# Patient Record
Sex: Female | Born: 1970 | Race: White | Hispanic: No | Smoking: Never smoker
Health system: Southern US, Community
[De-identification: ages and names within clinical notes are randomized; demographics above are authoritative.]

## PROBLEM LIST (undated history)

## (undated) DIAGNOSIS — Z6841 Body Mass Index (BMI) 40.0 and over, adult: Secondary | ICD-10-CM

## (undated) DIAGNOSIS — G4730 Sleep apnea, unspecified: Secondary | ICD-10-CM

## (undated) DIAGNOSIS — L120 Bullous pemphigoid: Secondary | ICD-10-CM

## (undated) DIAGNOSIS — K219 Gastro-esophageal reflux disease without esophagitis: Secondary | ICD-10-CM

## (undated) DIAGNOSIS — J45909 Unspecified asthma, uncomplicated: Secondary | ICD-10-CM

## (undated) DIAGNOSIS — J309 Allergic rhinitis, unspecified: Secondary | ICD-10-CM

## (undated) DIAGNOSIS — I10 Essential (primary) hypertension: Secondary | ICD-10-CM

## (undated) DIAGNOSIS — R6889 Other general symptoms and signs: Secondary | ICD-10-CM

## (undated) DIAGNOSIS — F39 Unspecified mood [affective] disorder: Secondary | ICD-10-CM

## (undated) DIAGNOSIS — Z9989 Dependence on other enabling machines and devices: Secondary | ICD-10-CM

## (undated) DIAGNOSIS — F32A Depression, unspecified: Secondary | ICD-10-CM

## (undated) DIAGNOSIS — F329 Major depressive disorder, single episode, unspecified: Secondary | ICD-10-CM

## (undated) HISTORY — DX: Depression, unspecified: F32.A

## (undated) HISTORY — DX: Major depressive disorder, single episode, unspecified: F32.9

## (undated) HISTORY — DX: Gastro-esophageal reflux disease without esophagitis: K21.9

## (undated) HISTORY — PX: ADENOIDECTOMY: SUR15

## (undated) HISTORY — PX: HX CARPAL TUNNEL RELEASE: SHX101

## (undated) HISTORY — DX: Allergic rhinitis, unspecified: J30.9

## (undated) HISTORY — DX: Essential (primary) hypertension: I10

## (undated) HISTORY — PX: HX BUNIONECTOMY: SHX129

## (undated) HISTORY — DX: Unspecified mood (affective) disorder: F39

## (undated) HISTORY — PX: GASTROSCOPY: WVUENDOPRO49

## (undated) HISTORY — PX: HX GALL BLADDER SURGERY/CHOLE: SHX55

## (undated) HISTORY — DX: Bullous pemphigoid: L12.0

## (undated) HISTORY — PX: HX COLONOSCOPY: 2100001147

## (undated) HISTORY — PX: HX WISDOM TEETH EXTRACTION: SHX21

## (undated) HISTORY — DX: Body Mass Index (BMI) 40.0 and over, adult: Z684

---

## 1998-01-26 ENCOUNTER — Ambulatory Visit (HOSPITAL_COMMUNITY): Admission: RE | Admit: 1998-01-26 | Discharge: 1998-01-26 | Payer: Self-pay | Admitting: *Deleted

## 1998-07-04 ENCOUNTER — Other Ambulatory Visit: Admission: RE | Admit: 1998-07-04 | Discharge: 1998-07-04 | Payer: Self-pay | Admitting: Obstetrics and Gynecology

## 1999-02-20 ENCOUNTER — Inpatient Hospital Stay (HOSPITAL_COMMUNITY): Admission: AD | Admit: 1999-02-20 | Discharge: 1999-02-24 | Payer: Self-pay | Admitting: Obstetrics and Gynecology

## 1999-06-27 ENCOUNTER — Other Ambulatory Visit: Admission: RE | Admit: 1999-06-27 | Discharge: 1999-06-27 | Payer: Self-pay | Admitting: Obstetrics and Gynecology

## 1999-08-26 HISTORY — PX: CHOLECYSTECTOMY: SHX55

## 1999-09-04 ENCOUNTER — Inpatient Hospital Stay (HOSPITAL_COMMUNITY): Admission: EM | Admit: 1999-09-04 | Discharge: 1999-09-09 | Payer: Self-pay | Admitting: Emergency Medicine

## 1999-09-04 ENCOUNTER — Encounter: Payer: Self-pay | Admitting: Emergency Medicine

## 1999-09-04 ENCOUNTER — Encounter (INDEPENDENT_AMBULATORY_CARE_PROVIDER_SITE_OTHER): Payer: Self-pay | Admitting: Specialist

## 1999-09-08 ENCOUNTER — Encounter: Payer: Self-pay | Admitting: General Surgery

## 1999-09-08 ENCOUNTER — Encounter: Payer: Self-pay | Admitting: Gastroenterology

## 2001-04-01 ENCOUNTER — Other Ambulatory Visit: Admission: RE | Admit: 2001-04-01 | Discharge: 2001-04-01 | Payer: Self-pay | Admitting: Obstetrics and Gynecology

## 2002-05-03 ENCOUNTER — Other Ambulatory Visit: Admission: RE | Admit: 2002-05-03 | Discharge: 2002-05-03 | Payer: Self-pay | Admitting: Obstetrics and Gynecology

## 2003-04-06 ENCOUNTER — Other Ambulatory Visit: Admission: RE | Admit: 2003-04-06 | Discharge: 2003-04-06 | Payer: Self-pay | Admitting: Obstetrics and Gynecology

## 2003-06-09 ENCOUNTER — Other Ambulatory Visit: Admission: RE | Admit: 2003-06-09 | Discharge: 2003-06-09 | Payer: Self-pay | Admitting: Obstetrics and Gynecology

## 2003-07-22 ENCOUNTER — Ambulatory Visit (HOSPITAL_COMMUNITY): Admission: RE | Admit: 2003-07-22 | Discharge: 2003-07-22 | Payer: Self-pay | Admitting: Family Medicine

## 2004-05-24 ENCOUNTER — Other Ambulatory Visit: Admission: RE | Admit: 2004-05-24 | Discharge: 2004-05-24 | Payer: Self-pay | Admitting: Obstetrics and Gynecology

## 2006-01-03 ENCOUNTER — Ambulatory Visit: Payer: Self-pay | Admitting: Family Medicine

## 2006-01-24 ENCOUNTER — Other Ambulatory Visit: Admission: RE | Admit: 2006-01-24 | Discharge: 2006-01-24 | Payer: Self-pay | Admitting: Obstetrics and Gynecology

## 2006-12-09 ENCOUNTER — Ambulatory Visit: Payer: Self-pay | Admitting: Family Medicine

## 2008-06-29 ENCOUNTER — Ambulatory Visit: Payer: Self-pay | Admitting: Family Medicine

## 2008-06-29 DIAGNOSIS — M545 Low back pain, unspecified: Secondary | ICD-10-CM | POA: Insufficient documentation

## 2008-06-29 DIAGNOSIS — F32A Depression, unspecified: Secondary | ICD-10-CM | POA: Insufficient documentation

## 2008-06-29 DIAGNOSIS — F329 Major depressive disorder, single episode, unspecified: Secondary | ICD-10-CM

## 2008-07-04 ENCOUNTER — Telehealth: Payer: Self-pay | Admitting: Family Medicine

## 2008-07-20 ENCOUNTER — Ambulatory Visit: Payer: Self-pay | Admitting: Family Medicine

## 2008-08-18 ENCOUNTER — Telehealth: Payer: Self-pay | Admitting: Family Medicine

## 2010-10-12 NOTE — Assessment & Plan Note (Signed)
[facility]                              [facility] OFFICE NOTE   SHEILA, OCASIO                     MRN:          [id number]  DATE:01/03/2006                            DOB:          01-Apr-1971    This is the first [facility]  visit for this 40 year old divorced  female who comes in today as a new patient for evaluation of three problems.   PAST MEDICAL HISTORY:  Hospitalized for gallbladder removal after her  first  child in 2001.  Prior to that a T&A as a child.   OUTPATIENT SURGERY:  None.   DRUG ALLERGIES:  NONE.   She does not smoke or drink any alcohol.   CURRENT MEDICATIONS:  Allegra D for allergic rhinitis.  Allergy shots via  Dr. [place] Callas at [facility].  For birth control she uses a  Nuvaring.  She uses [facility] for that.  They do her Paps, etc.   SOCIAL HISTORY:  She is divorced.  She works here in [place].  She works  for [employer].  She is a Environmental manager.  Originally from [place].   FAMILY HISTORY:  She does not know anything about her father.  She knows he  is 72.  He lives in [place] but does not know anything about his health.  She thinks he is in good health, she is not sure.  Mother is 97 and she  states mother had ovarian cancer at age 80, is doing well with no problems.  No brothers, no sisters.   Vaccinations:  She had tetanus in 1998.   PROBLEM LIST:  Patient is complaining of three things;  1. Low back pain for six years.  The patient says her low back pain comes      and goes.  It occurs when she sits for a long time.  It decreases with      movement. She describes it as sometimes sharp, sometimes dull.  On a      scale of 1 to 10 it is a 2-3.  She says sometimes it hurts in her back      but most of the time it hurts her left posterior thigh.  Again the pain      is decreased by moving, increased by sitting.  She has been going to a      chiropractor off and on for six years and  has finally decided to find a      cause of her pain.  She has also been evaluated by [facility].  No diagnosis was found.  2. She has had bilateral posterior thoracic back pain.  She describes that      as also a come and go pain, decreased movement, sometimes sharp,      sometimes dull, 6 on a scale of 1 to 10.  3. She has numbness in her right and left arms.  She says sometimes her      whole arms get numb, sometimes just confined to the thumb and the index  finger. She has no history of cervical trauma.   REVIEW OF SYSTEMS:  Negative.   PHYSICAL EXAMINATION:  VITAL SIGNS:  Height 5 feet 6 inches, weight 171  pounds, temperature 98.4, pulse 70 and regular, respirations 12, BP 118/82.  GENERAL: She is a well-developed, well-nourished female in no acute  distress.  SPINE:  Normal.  No scoliosis.  LOWER EXTREMITIES:  Examination normal, no leg length discrepancy.  NEUROLOGIC EXAM:  There is full range of motion of the neck, arms, and  shoulders.  I can elicit no tenderness today.  Examination of the spine  again was normal.  Examination of the lower extremities shows normal  sensation, reflexes and muscle strength. Examination of the upper  extremities again shows normal strength, reflexes and muscle strength.   Three distinct issues with the pain posterior left thigh, bilateral thoracic  pain, and the numbness and tingling of her fingers I do not think are  related although they could be. Her symptoms have been ongoing for  six  years so I doubt if they are related to anything serious like multiple  sclerosis, etc., however, she has never had a complete evaluation to see if  we can find a source of her problem, therefore, recommend neurologic  evaluation to see if we can find the etiology of her discomfort.  This will  be set up ASAP.                                   Jeffrey A. Tawanna Cooler, MD   JAT/MedQ  DD:  01/03/2006  DT:  01/03/2006  Job #:  [id number]   cc:    [facility]

## 2011-07-31 ENCOUNTER — Encounter: Payer: Self-pay | Admitting: Internal Medicine

## 2011-07-31 ENCOUNTER — Telehealth: Payer: Self-pay | Admitting: Internal Medicine

## 2011-07-31 ENCOUNTER — Ambulatory Visit (INDEPENDENT_AMBULATORY_CARE_PROVIDER_SITE_OTHER): Payer: BC Managed Care – PPO | Admitting: Internal Medicine

## 2011-07-31 VITALS — BP 118/74 | HR 99 | Ht 62.0 in | Wt 220.0 lb

## 2011-07-31 DIAGNOSIS — G4733 Obstructive sleep apnea (adult) (pediatric): Secondary | ICD-10-CM

## 2011-07-31 DIAGNOSIS — J309 Allergic rhinitis, unspecified: Secondary | ICD-10-CM

## 2011-07-31 DIAGNOSIS — J3089 Other allergic rhinitis: Secondary | ICD-10-CM

## 2011-07-31 NOTE — Patient Instructions (Signed)
Order- [facility]  Home unattended "Cathy [name]" sleep study-   Dx OSA

## 2011-07-31 NOTE — Progress Notes (Signed)
07/31/11- 40 yoF referred by Dr Christell Constant for sleep evaluation. She complains that for 20 years she has had difficulty initiating and maintaining sleep. She sleeps about 2 hours then wakes up intermittently through the rest of the night. If she can't sleep she will take Benadryl which helps to fall asleep but not to stay asleep. Uses when for white noise. Bedtime 10 and 11 PM. Short sleep latency. Sleeps alone. Back hurts. Kicks in sleep. Loud snoring scares her son. Wakes 3 times during the night before up at 6 AM. Always feels tired during the day. Weight is up 10-20 pounds in the last 2 years. Son had obstructive sleep apnea before tonsillectomy. Perennial allergic rhinitis treated by Dr. [place] Callas. She was on allergy shots but drifted off. Uses Allegra-D. Nose sprays cause headache. Not clear that nasal congestion interferes with sleep. Adenoidectomy years ago. Some history of depression. Divorced, living with 50 year old son. 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  ROS-see HPI Constitutional:   No-   weight loss, night sweats, fevers, chills, +fatigue, lassitude. HEENT:   No-  headaches, difficulty swallowing, tooth/dental problems, sore throat,      +sneezing, itching, ear ache, nasal congestion, post nasal drip,  CV:  No-   chest pain, orthopnea, PND, swelling in lower extremities, anasarca, dizziness, palpitations Resp: No-   shortness of breath with exertion or at rest.              No-   productive cough,  No non-productive cough,  No- coughing up of blood.              No-   change in color of mucus.  No- wheezing.   Skin: No-   rash or lesions. GI:  No-   heartburn, indigestion, abdominal pain, nausea, vomiting, GU: No-   dysuria, . MS:  No-   joint pain or swelling.  No- decreased range of motion.  + back pain. Neuro-     nothing unusual Psych:  No- change in mood or affect. No depression or anxiety.  No memory loss.  OBJ- Physical Exam General- Alert, Oriented, Affect-appropriate,  Distress- none acute, overweight Skin- rash-none, lesions- none, excoriation- none Lymphadenopathy- none Head- atraumatic            Eyes- Gross vision intact, PERRLA, conjunctivae and secretions clear            Ears- Hearing, canals-normal            Nose- Clear, mild Septal dev, mucus, polyps, erosion, perforation, + sniffing             Throat- Mallampati III , mucosa clear , drainage- none, tonsils- atrophic Neck- flexible , trachea midline, no stridor , thyroid nl, carotid no bruit Chest - symmetrical excursion , unlabored           Heart/CV- RRR , no murmur , no gallop  , no rub, nl s1 s2                           - JVD- none , edema- none, stasis changes- none, varices- none           Lung- clear to P&A, wheeze- none, cough- none , dullness-none, rub- none           Chest wall-  Abd- tender-no, distended-no, bowel sounds-present, HSM- no Br/ Gen/ Rectal- Not done, not indicated Extrem- cyanosis- none, clubbing, none, atrophy- none, strength- nl Neuro- grossly intact to  observation

## 2011-07-31 NOTE — Telephone Encounter (Signed)
Dr. Maple Hudson, please advise if ok to send order to [facility] for formal referral to sleep lab because a home sleep test is not covered by pt's insurance.  Thank you.

## 2011-08-01 DIAGNOSIS — G4733 Obstructive sleep apnea (adult) (pediatric): Secondary | ICD-10-CM | POA: Insufficient documentation

## 2011-08-01 NOTE — Telephone Encounter (Signed)
Addended by: Boone Master E on: 08/01/2011 12:01 PM   Modules accepted: Orders

## 2011-08-01 NOTE — Telephone Encounter (Signed)
OK ORDER-  [facility]  split NPSG   Dx OSA

## 2011-08-01 NOTE — Telephone Encounter (Signed)
Ordered entered incorrectly.  Fixed.

## 2011-08-01 NOTE — Telephone Encounter (Signed)
Order sent.  Will sign off.

## 2011-08-04 DIAGNOSIS — J3089 Other allergic rhinitis: Secondary | ICD-10-CM | POA: Insufficient documentation

## 2011-08-04 NOTE — Assessment & Plan Note (Signed)
Disorder of initiating and maintaining sleep. Loud snoring and weight gains suggest the problem may include obstructive sleep apnea. Back pain and depression may also interfere with comfortable sleep. Sleep hygiene was discussed. Plan-we discussed and are scheduling a sleep study.

## 2011-09-03 ENCOUNTER — Ambulatory Visit: Payer: BC Managed Care – PPO | Admitting: Internal Medicine

## 2011-09-06 ENCOUNTER — Ambulatory Visit (HOSPITAL_BASED_OUTPATIENT_CLINIC_OR_DEPARTMENT_OTHER): Payer: BC Managed Care – PPO | Attending: Internal Medicine

## 2011-09-06 DIAGNOSIS — G4733 Obstructive sleep apnea (adult) (pediatric): Secondary | ICD-10-CM | POA: Insufficient documentation

## 2011-09-06 DIAGNOSIS — Z9989 Dependence on other enabling machines and devices: Secondary | ICD-10-CM

## 2011-09-15 DIAGNOSIS — R0609 Other forms of dyspnea: Secondary | ICD-10-CM

## 2011-09-15 DIAGNOSIS — R0989 Other specified symptoms and signs involving the circulatory and respiratory systems: Secondary | ICD-10-CM

## 2011-09-15 DIAGNOSIS — G4733 Obstructive sleep apnea (adult) (pediatric): Secondary | ICD-10-CM

## 2011-09-15 NOTE — Procedures (Signed)
Cathy, [name]              ACCOUNT NO.:  [id number]  MEDICAL RECORD NO.:  [id number]          PATIENT TYPE:  OUT  LOCATION:  SLEEP CENTER                 FACILITY:  [facility]  PHYSICIAN:  Roel Douthat D. Maple Hudson, MD, FCCP, FACPDATE OF BIRTH:  04[birth year]69  DATE OF STUDY:  09/06/2011                           NOCTURNAL POLYSOMNOGRAM  REFERRING PHYSICIAN:  Feleshia Zundel D. Maple Hudson, MD, [name], FACP  REFERRING PHYSICIAN:  Marcene Duos, MD.  INDICATION FOR STUDY:  Hypersomnia with sleep apnea.  EPWORTH SLEEPINESS SCORE:  14/24.  BMI 38.4, weight 210 pounds, height 62 inches, neck 14 inches.  MEDICATIONS:  Charted and reviewed.  SLEEP ARCHITECTURE:  Split study protocol.  During the diagnostic phase, total sleep time 123 minutes with sleep efficiency 65.8%.  Stage I was 14.6%, stage II of 85.4%, stage III and REM were absent.  Sleep latency 35 minutes, awake after sleep onset 30.5 minutes, arousal index 21.5.  BEDTIME MEDICATION:  Benadryl.  RESPIRATORY DATA:  Split study protocol.  Apnea/hypopnea index (AHI) 17.1 per hour.  A total of 35 events was scored including 17 obstructive apneas, 1 central apnea, 17 hypopneas.  Events were associated with non- supine sleep position.  CPAP was then titrated to 10 CWP, AHI 2.4 per hour.  She wore a medium ResMed Quattro FX full-face mask with heated humidifier.  OXYGEN DATA:  Before CPAP, snoring was moderately loud with oxygen desaturation to a nadir of 89% on room air.  After CPAP titration, snoring was prevented and mean oxygen saturation held 96.7% on room air.  CARDIAC DATA:  Normal sinus rhythm.  MOVEMENT-PARASOMNIA:  No significant movement disturbance. Bathroom x2.  IMPRESSIONS-RECOMMENDATIONS: 1. Moderate obstructive sleep apnea/hypopnea syndrome, apnea/hypopnea     17.1 per hour.  Non-supine events.  Moderately loud snoring with     oxygen desaturation to a nadir of 89% on room air. 2. Successful continuous positive airway pressure  titration to 10     centimeters of water pressure, apnea/hypopnea     index 2.4 per hour.  She wore a medium ResMed Quattro FX full-face     mask with heated humidifier.  Snoring was prevented and mean oxygen     saturation held 96.7% on room air.     Amoni Scallan D. Maple Hudson, MD, [facility], FACP Diplomate, American Board of Sleep Medicine    CDY/MEDQ  D:  09/15/2011 09:08:54  T:  09/15/2011 09:24:02  Job:  [id number]

## 2011-09-20 ENCOUNTER — Ambulatory Visit (INDEPENDENT_AMBULATORY_CARE_PROVIDER_SITE_OTHER): Payer: BC Managed Care – PPO | Admitting: Internal Medicine

## 2011-09-20 ENCOUNTER — Encounter: Payer: Self-pay | Admitting: Internal Medicine

## 2011-09-20 VITALS — BP 114/86 | HR 85 | Ht 62.0 in | Wt 220.8 lb

## 2011-09-20 DIAGNOSIS — G4733 Obstructive sleep apnea (adult) (pediatric): Secondary | ICD-10-CM

## 2011-09-20 NOTE — Patient Instructions (Signed)
Order- new CPAP 10, mask of choice, humidifier, supplies    Dx OSA  If you find you would like anything for sleep stronger than benadryl, just let me know  Booklet on sleep apnea  Your score was 17.1/ hr, which is moderately severe for obstructive sleep apnea. This is well worth the effort to treat.

## 2011-09-20 NOTE — Progress Notes (Signed)
07/31/11- 40 yoF referred by Dr Christell Constant for sleep evaluation. She complains that for 20 years she has had difficulty initiating and maintaining sleep. She sleeps about 2 hours then wakes up intermittently through the rest of the night. If she can't sleep she will take Benadryl which helps to fall asleep but not to stay asleep. Uses when for white noise. Bedtime 10 and 11 PM. Short sleep latency. Sleeps alone. Back hurts. Kicks in sleep. Loud snoring scares her son. Wakes 3 times during the night before up at 6 AM. Always feels tired during the day. Weight is up 10-20 pounds in the last 2 years. Son had obstructive sleep apnea before tonsillectomy. Perennial allergic rhinitis treated by Dr. [place] Callas. She was on allergy shots but drifted off. Uses Allegra-D. Nose sprays cause headache. Not clear that nasal congestion interferes with sleep. Adenoidectomy years ago. Some history of depression. Divorced, living with 26 year old son. 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  09/20/11- 41 yoF referred by Dr Christell Constant for sleep evaluation. Chronic insomnia. Rhinitis/Dr Sharma NPSG 09/06/11- AHI 17.1/ hr, CPAP titrated to 10/ AHI 2.4/hr  Moderate sleep apnea. We discussed medical issues and treatment options.  ROS-see HPI Constitutional:   No-   weight loss, night sweats, fevers, chills, +fatigue, lassitude. HEENT:   No-  headaches, difficulty swallowing, tooth/dental problems, sore throat,      +sneezing, itching, ear ache, nasal congestion, post nasal drip,  CV:  No-   chest pain, orthopnea, PND, swelling in lower extremities, anasarca, dizziness, palpitations Resp: No-   shortness of breath with exertion or at rest.              No-   productive cough,  No non-productive cough,  No- coughing up of blood.              No-   change in color of mucus.  No- wheezing.   Skin: No-   rash or lesions. GI:  No-   heartburn, indigestion, abdominal pain, nausea, vomiting, GU: No-   dysuria, . MS:  No-   joint pain or  swelling.   Neuro-     nothing unusual Psych:  No- change in mood or affect. No depression or anxiety.  No memory loss.  OBJ- Physical Exam General- Alert, Oriented, Affect-appropriate, Distress- none acute, overweight Skin- rash-none, lesions- none, excoriation- none Lymphadenopathy- none Head- atraumatic            Eyes- Gross vision intact, PERRLA, conjunctivae and secretions clear            Ears- Hearing, canals-normal            Nose- Minor crusting, mild Septal dev, mucus, polyps, erosion, perforation, + sniffing             Throat- Mallampati III , mucosa clear , drainage- none, tonsils- atrophic Neck- flexible , trachea midline, no stridor , thyroid nl, carotid no bruit Chest - symmetrical excursion , unlabored           Heart/CV- RRR , no murmur , no gallop  , no rub, nl s1 s2                           - JVD- none , edema- none, stasis changes- none, varices- none           Lung- clear to P&A, wheeze- none, cough- none , dullness-none, rub- none  Chest wall-  Abd-  Br/ Gen/ Rectal- Not done, not indicated Extrem- cyanosis- none, clubbing, none, atrophy- none, strength- nl Neuro- grossly intact to observation

## 2011-09-26 NOTE — Assessment & Plan Note (Signed)
Moderate obstructive sleep apnea/hypopnea. Weight loss would help. Plan-booklet on sleep apnea given. Start CPAP 10 CWP and return for followup as directed.

## 2011-11-01 ENCOUNTER — Ambulatory Visit (INDEPENDENT_AMBULATORY_CARE_PROVIDER_SITE_OTHER): Payer: BC Managed Care – PPO | Admitting: Internal Medicine

## 2011-11-01 ENCOUNTER — Encounter: Payer: Self-pay | Admitting: Internal Medicine

## 2011-11-01 VITALS — BP 120/78 | HR 97 | Ht 62.0 in | Wt 232.4 lb

## 2011-11-01 DIAGNOSIS — J309 Allergic rhinitis, unspecified: Secondary | ICD-10-CM

## 2011-11-01 DIAGNOSIS — G4733 Obstructive sleep apnea (adult) (pediatric): Secondary | ICD-10-CM

## 2011-11-01 DIAGNOSIS — J3089 Other allergic rhinitis: Secondary | ICD-10-CM

## 2011-11-01 MED ORDER — TEMAZEPAM 15 MG PO CAPS: ORAL_CAPSULE | ORAL | Status: DC

## 2011-11-01 NOTE — Progress Notes (Signed)
07/31/11- 40 yoF referred by Dr Christell Constant for sleep evaluation. She complains that for 20 years she has had difficulty initiating and maintaining sleep. She sleeps about 2 hours then wakes up intermittently through the rest of the night. If she can't sleep she will take Benadryl which helps to fall asleep but not to stay asleep. Uses when for white noise. Bedtime 10 and 11 PM. Short sleep latency. Sleeps alone. Back hurts. Kicks in sleep. Loud snoring scares her son. Wakes 3 times during the night before up at 6 AM. Always feels tired during the day. Weight is up 10-20 pounds in the last 2 years. Son had obstructive sleep apnea before tonsillectomy. Perennial allergic rhinitis treated by Dr. [place] Callas. She was on allergy shots but drifted off. Uses Allegra-D. Nose sprays cause headache. Not clear that nasal congestion interferes with sleep. Adenoidectomy years ago. Some history of depression. Divorced, living with 43 year old son. 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  09/20/11- 41 yoF referred by Dr Christell Constant for sleep evaluation. Chronic insomnia. Rhinitis/Dr Sharma NPSG 09/06/11- AHI 17.1/ hr, CPAP titrated to 10/ AHI 2.4/hr  Moderate sleep apnea. We discussed medical issues and treatment options.  11/01/11- 41 yoF referred by Dr Christell Constant OSA complicated by chronic insomnia. Rhinitis/Dr [place] Callas Pt states wears mask for 6-7 hours/weekly has no issues with mask or pressure . Pt states she needs a new hose.Marland Kitchenalso seems to be still waking up alot . She definitely sleeps better with CPAP 10/ Advanced, but still notices frequent waking. Wearing a chin strap. Nasal sprays give her a headache. Takes Allegra-D 12 hour, only early in the morning. Considers that she has spring pollen allergy.  ROS-see HPI Constitutional:   No-   weight loss, night sweats, fevers, chills, +fatigue, lassitude. HEENT:   No-  headaches, difficulty swallowing, tooth/dental problems, sore throat,      +sneezing, itching, ear ache, nasal  congestion, post nasal drip,  CV:  No-   chest pain, orthopnea, PND, swelling in lower extremities, anasarca, dizziness, palpitations Resp: No-   shortness of breath with exertion or at rest.              No-   productive cough,  No non-productive cough,  No- coughing up of blood.              No-   change in color of mucus.  No- wheezing.   Skin: No-   rash or lesions. GI:  No-   heartburn, indigestion, abdominal pain, nausea, vomiting, GU:  . MS:  No-   joint pain or swelling.   Neuro-     nothing unusual Psych:  No- change in mood or affect. No depression or anxiety.  No memory loss.  OBJ- Physical Exam General- Alert, Oriented, Affect-appropriate, Distress- none acute, overweight Skin- rash-none, lesions- none, excoriation- none Lymphadenopathy- none Head- atraumatic            Eyes- Gross vision intact, PERRLA, conjunctivae and secretions clear            Ears- Hearing, canals-normal            Nose- mild stuffiness, mild Septal dev, mucus, polyps, erosion, perforation, + sniffing             Throat- Mallampati III , mucosa clear , drainage- none, tonsils- atrophic Neck- flexible , trachea midline, no stridor , thyroid nl, carotid no bruit Chest - symmetrical excursion , unlabored           Heart/CV- RRR ,  no murmur , no gallop  , no rub, nl s1 s2                           - JVD- none , edema- none, stasis changes- none, varices- none           Lung- clear to P&A, wheeze- none, cough- none , dullness-none, rub- none           Chest wall-  Abd-  Br/ Gen/ Rectal- Not done, not indicated Extrem- cyanosis- none, clubbing, none, atrophy- none, strength- nl Neuro- grossly intact to observation

## 2011-11-01 NOTE — Patient Instructions (Addendum)
Continue CPAP 10. Please call the DME company or [place] as needed.  Sample Dymista nasal spray   1 or 2 puffs each nostril, once daily at bedtime  Script for temazepam to try for sleep if needed

## 2011-11-07 NOTE — Assessment & Plan Note (Addendum)
Good compliance and control. Weight loss is encouraged. Try Temazepam for occasional insomnia with discussion of good sleep hygiene.

## 2011-12-24 ENCOUNTER — Encounter: Payer: Self-pay | Admitting: Internal Medicine

## 2012-02-03 ENCOUNTER — Ambulatory Visit (INDEPENDENT_AMBULATORY_CARE_PROVIDER_SITE_OTHER): Payer: BC Managed Care – PPO | Admitting: Internal Medicine

## 2012-02-03 ENCOUNTER — Encounter: Payer: Self-pay | Admitting: Internal Medicine

## 2012-02-03 VITALS — BP 120/78 | HR 91 | Ht 62.0 in | Wt 239.2 lb

## 2012-02-03 DIAGNOSIS — J3089 Other allergic rhinitis: Secondary | ICD-10-CM

## 2012-02-03 DIAGNOSIS — G4733 Obstructive sleep apnea (adult) (pediatric): Secondary | ICD-10-CM

## 2012-02-03 DIAGNOSIS — J40 Bronchitis, not specified as acute or chronic: Secondary | ICD-10-CM

## 2012-02-03 DIAGNOSIS — J309 Allergic rhinitis, unspecified: Secondary | ICD-10-CM

## 2012-02-03 DIAGNOSIS — Z23 Encounter for immunization: Secondary | ICD-10-CM

## 2012-02-03 MED ORDER — TEMAZEPAM 15 MG PO CAPS: ORAL_CAPSULE | ORAL | Status: DC

## 2012-02-03 MED ORDER — AZELASTINE-FLUTICASONE 137-50 MCG/ACT NA SUSP: 1.0000 | Freq: Every day | NASAL | Status: DC

## 2012-02-03 NOTE — Progress Notes (Signed)
07/31/11- 40 yoF referred by Dr Christell Constant for sleep evaluation. She complains that for 20 years she has had difficulty initiating and maintaining sleep. She sleeps about 2 hours then wakes up intermittently through the rest of the night. If she can't sleep she will take Benadryl which helps to fall asleep but not to stay asleep. Uses when for white noise. Bedtime 10 and 11 PM. Short sleep latency. Sleeps alone. Back hurts. Kicks in sleep. Loud snoring scares her son. Wakes 3 times during the night before up at 6 AM. Always feels tired during the day. Weight is up 10-20 pounds in the last 2 years. Son had obstructive sleep apnea before tonsillectomy. Perennial allergic rhinitis treated by Dr. [place] Callas. She was on allergy shots but drifted off. Uses Allegra-D. Nose sprays cause headache. Not clear that nasal congestion interferes with sleep. Adenoidectomy years ago. Some history of depression. Divorced, living with 25 year old son. 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  09/20/11- 41 yoF referred by Dr Christell Constant for sleep evaluation. Chronic insomnia. Rhinitis/Dr Sharma NPSG 09/06/11- AHI 17.1/ hr, CPAP titrated to 10/ AHI 2.4/hr  Moderate sleep apnea. We discussed medical issues and treatment options.  11/01/11- 41 yoF referred by Dr Christell Constant OSA complicated by chronic insomnia. Rhinitis/Dr [place] Callas Pt states wears mask for 6-7 hours/weekly has no issues with mask or pressure . Pt states she needs a new hose.Marland Kitchenalso seems to be still waking up alot . She definitely sleeps better with CPAP 10/ Advanced, but still notices frequent waking. Wearing a chin strap. Nasal sprays give her a headache. Takes Allegra-D 12 hour, only early in the morning. Considers that she has spring pollen allergy.  02/03/12- 41 yoF referred by Dr Christell Constant OSA complicated by chronic insomnia. Rhinitis/Dr [place] Callas Temazepam helping and would like a refill; Wears CPAP 10/ Advanced every night for approximately 6-7 hours. Her primary physician suggested  she use Symbicort 80 for the start of August through winter to reduce airway inflammation and reduced her weight or respiratory complaints. She describes increased fatigue as well as rhinitis and bronchitis pattern each winter. Currently she feels well.  ROS-see HPI Constitutional:   No-   weight loss, night sweats, fevers, chills, +fatigue, lassitude. HEENT:   No-  headaches, difficulty swallowing, tooth/dental problems, sore throat,      +sneezing, itching, ear ache, nasal congestion, post nasal drip,  CV:  No-   chest pain, orthopnea, PND, swelling in lower extremities, anasarca, dizziness, palpitations Resp: No-   shortness of breath with exertion or at rest.              No-   productive cough,  No non-productive cough,  No- coughing up of blood.              No-   change in color of mucus.  No- wheezing.   Skin: No-   rash or lesions. GI:  No-   heartburn, indigestion, abdominal pain, nausea, vomiting, GU:  . MS:  No-   joint pain or swelling.   Neuro-     nothing unusual Psych:  No- change in mood or affect. No depression or anxiety.  No memory loss.  OBJ- Physical Exam General- Alert, Oriented, Affect-appropriate, Distress- none acute, overweight Skin- rash-none, lesions- none, excoriation- none Lymphadenopathy- none Head- atraumatic            Eyes- Gross vision intact, PERRLA, conjunctivae and secretions clear            Ears- Hearing, canals-normal  Nose- mild stuffiness, mild Septal dev, mucus, polyps, erosion, perforation, + sniffing             Throat- Mallampati III , mucosa clear , drainage- none, tonsils- atrophic Neck- flexible , trachea midline, no stridor , thyroid nl, carotid no bruit Chest - symmetrical excursion , unlabored           Heart/CV- RRR , no murmur , no gallop  , no rub, nl s1 s2                           - JVD- none , edema- none, stasis changes- none, varices- none           Lung- clear to P&A, wheeze- none, cough- none , dullness-none, rub-  none           Chest wall-  Abd-  Br/ Gen/ Rectal- Not done, not indicated Extrem- cyanosis- none, clubbing, none, atrophy- none, strength- nl Neuro- grossly intact to observation

## 2012-02-03 NOTE — Patient Instructions (Addendum)
Watch during the winter to see if the pattern you notice is more tired/ lack of motivation/ lack of energy, or more of a short of breath, chest tight, cough/ wheeze airway problem.  You can certainly start the Symbicort 80 from Dr Christell Constant.   You might want to try 1/2 of a caffeine caplet (otc, like NoDoz) in the morning after breakfast.   Refill scripts for Dymista and for Temazepam  Flu vaccine

## 2012-02-10 DIAGNOSIS — J40 Bronchitis, not specified as acute or chronic: Secondary | ICD-10-CM | POA: Insufficient documentation

## 2012-02-10 NOTE — Assessment & Plan Note (Signed)
Good compliance and control with CPAP 10/Advanced Temazepam is refilled

## 2012-02-10 NOTE — Assessment & Plan Note (Signed)
She liked sample Dymisa and we will give prescription.

## 2012-02-10 NOTE — Assessment & Plan Note (Addendum)
She describes seasonal recurrent bronchitis during fall and winter each year, strongly suggestive of viral infection pattern. I agree with Dr. Christell Constant that it might be helpful to stay on maintenance controller medication such as Symbicort 80. Plan-flu vaccine

## 2012-02-19 ENCOUNTER — Telehealth: Payer: Self-pay | Admitting: Internal Medicine

## 2012-02-19 MED ORDER — FLUTICASONE PROPIONATE 50 MCG/ACT NA SUSP: NASAL | Status: DC

## 2012-02-19 MED ORDER — AZELASTINE HCL 0.1 % NA SOLN: NASAL | Status: DC

## 2012-02-19 NOTE — Telephone Encounter (Signed)
Member # [id number]. Dymista is not covered by the pt's insurance. New prescriptions have been sent to the pharmacy for both the azelastine and fluticasone separately. LMOMTCB x1 for the pt so we can notify her of this.

## 2012-03-17 NOTE — Telephone Encounter (Signed)
Pt aware of prescriptions.

## 2012-06-04 ENCOUNTER — Ambulatory Visit: Payer: BC Managed Care – PPO | Admitting: Internal Medicine

## 2012-09-29 ENCOUNTER — Other Ambulatory Visit: Payer: Self-pay

## 2012-09-29 NOTE — Telephone Encounter (Signed)
Last seen 02/21/12.

## 2012-09-30 MED ORDER — BUPROPION HCL ER (XL) 150 MG PO TB24: 150.0000 mg | ORAL_TABLET | Freq: Every day | ORAL | Status: DC

## 2012-10-05 ENCOUNTER — Ambulatory Visit (INDEPENDENT_AMBULATORY_CARE_PROVIDER_SITE_OTHER): Payer: BC Managed Care – PPO | Admitting: General Practice

## 2012-10-05 VITALS — BP 138/90 | HR 86 | Temp 97.7°F | Ht 62.0 in | Wt 255.0 lb

## 2012-10-05 DIAGNOSIS — J029 Acute pharyngitis, unspecified: Secondary | ICD-10-CM

## 2012-10-05 MED ORDER — AMOXICILLIN 875 MG PO TABS: 875.0000 mg | ORAL_TABLET | Freq: Two times a day (BID) | ORAL | Status: DC

## 2012-10-05 NOTE — Patient Instructions (Signed)

## 2012-10-05 NOTE — Progress Notes (Signed)
  Subjective:    Patient ID: Cathy [name], female    DOB: [birth year], 42 y.o.   MRN: [id number]  HPI Presents today with nasal congestion and sore throat that started on Friday. Reports taking motrin PM and cough medication that gave moderate relief. Reports aggravating factor as coughing which causes headache. Swallowing also causes pain in ears, right greater than left.     Review of Systems  Constitutional: Positive for fever. Negative for chills.  HENT: Positive for ear pain, congestion, sore throat and postnasal drip. Negative for sneezing and sinus pressure.        Right ear   Respiratory: Positive for cough. Negative for chest tightness.   Cardiovascular: Negative for chest pain.  Genitourinary: Negative for difficulty urinating.  Musculoskeletal: Negative for myalgias.  Skin: Negative.   Neurological: Positive for headaches. Negative for dizziness.  Psychiatric/Behavioral: Negative.        Objective:   Physical Exam  Constitutional: She is oriented to person, place, and time. She appears well-developed and well-nourished.  Eyes: Conjunctivae and EOM are normal.  Neck: Normal range of motion.  Cardiovascular: Normal rate, regular rhythm and normal heart sounds.   Pulmonary/Chest: Effort normal and breath sounds normal. No respiratory distress. She exhibits no tenderness.  Neurological: She is alert and oriented to person, place, and time.  Skin: Skin is warm and dry.  Psychiatric: She has a normal mood and affect.   Results for orders placed in visit on 10/05/12  POCT RAPID STREP A (OFFICE)      Result Value Range   Rapid Strep A Screen Positive (*) Negative          Assessment & Plan:  1. Sore throat - POCT rapid strep A  2. Acute pharyngitis  - amoxicillin (AMOXIL) 875 MG tablet; Take 1 tablet (875 mg total) by mouth 2 (two) times daily.  Dispense: 20 tablet; Refill: 0  Continue antibiotics for full course even if feeling better Increase fluid  intake Motrin or tylenol OTC OTC decongestant Throat lozenges if help New toothbrush in 3 days Proper handwashing Patient verbalized understanding Coralie Keens, FNP-C

## 2012-11-13 ENCOUNTER — Other Ambulatory Visit: Payer: Self-pay | Admitting: General Practice

## 2012-11-13 ENCOUNTER — Other Ambulatory Visit: Payer: Self-pay | Admitting: Nurse Practitioner

## 2012-11-16 NOTE — Telephone Encounter (Signed)
Once again I am not sure who prescribed this originally. She needs followup for taking  this and the Lexapro

## 2012-11-16 NOTE — Telephone Encounter (Signed)
Not sure who sees this patient in the practice. She needs to see someone in regard to continued use of taking Lexapro. May refill x1.

## 2012-11-16 NOTE — Telephone Encounter (Signed)
LAST OV 9/13 

## 2013-02-11 ENCOUNTER — Other Ambulatory Visit: Payer: Self-pay

## 2013-02-11 MED ORDER — BUPROPION HCL ER (XL) 150 MG PO TB24: 150.0000 mg | ORAL_TABLET | Freq: Every day | ORAL | Status: DC

## 2013-02-11 NOTE — Telephone Encounter (Signed)
Last seen 10/05/12  Cathy [name]  Last filled 11/23/12  Pharmacy requesting a 90 day supply

## 2013-03-28 ENCOUNTER — Other Ambulatory Visit: Payer: Self-pay | Admitting: Family Medicine

## 2013-04-08 ENCOUNTER — Ambulatory Visit (INDEPENDENT_AMBULATORY_CARE_PROVIDER_SITE_OTHER): Payer: BC Managed Care – PPO | Admitting: Family Medicine

## 2013-04-08 ENCOUNTER — Encounter: Payer: Self-pay | Admitting: Family Medicine

## 2013-04-08 VITALS — BP 129/92 | HR 82 | Temp 97.8°F | Ht 62.0 in | Wt 250.0 lb

## 2013-04-08 DIAGNOSIS — G4700 Insomnia, unspecified: Secondary | ICD-10-CM

## 2013-04-08 DIAGNOSIS — F329 Major depressive disorder, single episode, unspecified: Secondary | ICD-10-CM

## 2013-04-08 DIAGNOSIS — F3289 Other specified depressive episodes: Secondary | ICD-10-CM

## 2013-04-08 MED ORDER — ESCITALOPRAM OXALATE 10 MG PO TABS: 10.0000 mg | ORAL_TABLET | Freq: Every day | ORAL | Status: DC

## 2013-04-08 MED ORDER — BUPROPION HCL ER (XL) 150 MG PO TB24: 150.0000 mg | ORAL_TABLET | Freq: Every day | ORAL | Status: DC

## 2013-04-08 MED ORDER — TEMAZEPAM 15 MG PO CAPS: ORAL_CAPSULE | ORAL | Status: DC

## 2013-04-08 NOTE — Progress Notes (Signed)
  Subjective:    Patient ID: Cathy [name], female    DOB: July 15, 1970, 42 y.o.   MRN: [id number]  HPI Pt here for chronic follow-up for: Depression Pt taking Wellbutrin XL  And Lexapro daily-Works good and helps, however pt has been out of RX for a week. Insomnia  Pt takes benadryl or Motrin PM every night-Helps with sleep. Pt also wears CPAP at night. Pt use to have RX of restoril, but has not have had it in months. 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  Review of Systems  Respiratory: Negative.   Cardiovascular: Negative.   All other systems reviewed and are negative.       Objective:   Physical Exam  Vitals reviewed. Constitutional: She is oriented to person, place, and time. She appears well-developed and well-nourished.  HENT:  Head: Normocephalic.  Right Ear: External ear normal.  Left Ear: External ear normal.  Mouth/Throat: Oropharynx is clear and moist.  Eyes: Pupils are equal, round, and reactive to light.  Neck: Normal range of motion. Neck supple.  Cardiovascular: Normal rate, regular rhythm, normal heart sounds and intact distal pulses.   No murmur heard. Pulmonary/Chest: Effort normal and breath sounds normal. No respiratory distress. She has no wheezes.  Abdominal: Soft. Bowel sounds are normal. She exhibits no distension. There is no tenderness.  Musculoskeletal: Normal range of motion. She exhibits edema.  Neurological: She is alert and oriented to person, place, and time.  Skin: Skin is warm and dry.  Psychiatric: She has a normal mood and affect. Her behavior is normal. Judgment and thought content normal.   BP 129/92  Pulse 82  Temp(Src) 97.8 F (36.6 C) (Oral)  Ht 5\' 2"  (1.575 m)  Wt 250 lb (113.399 kg)  BMI 45.71 kg/m2        Assessment & Plan:  Insomnia - Plan: escitalopram (LEXAPRO) 10 MG tablet, temazepam (RESTORIL) 15 MG capsule, buPROPion (WELLBUTRIN XL) 150 MG 24 hr tablet  DEPRESSION - Plan:  escitalopram (LEXAPRO) 10 MG tablet, temazepam (RESTORIL) 15 MG capsule, buPROPion (WELLBUTRIN XL) 150 MG 24 hr tablet  Deatra Canter FNP

## 2013-04-08 NOTE — Patient Instructions (Signed)

## 2013-06-15 ENCOUNTER — Other Ambulatory Visit: Payer: Self-pay | Admitting: Family Medicine

## 2013-06-17 NOTE — Telephone Encounter (Signed)
Cathy [name] can you review please. Epic med list has lexapro and Wellbutrin. Please review. thanks

## 2013-07-20 ENCOUNTER — Other Ambulatory Visit: Payer: BC Managed Care – PPO | Admitting: Nurse Practitioner

## 2013-07-23 ENCOUNTER — Telehealth: Payer: Self-pay | Admitting: Nurse Practitioner

## 2013-07-23 NOTE — Telephone Encounter (Signed)
Appt scheduled for 07/27/13. She will ask for scopolamine patches at her appt.

## 2013-07-27 ENCOUNTER — Encounter: Payer: Self-pay | Admitting: Nurse Practitioner

## 2013-07-27 ENCOUNTER — Ambulatory Visit (INDEPENDENT_AMBULATORY_CARE_PROVIDER_SITE_OTHER): Payer: BC Managed Care – PPO | Admitting: Nurse Practitioner

## 2013-07-27 VITALS — BP 121/84 | HR 94 | Temp 96.1°F | Ht 62.0 in | Wt 253.0 lb

## 2013-07-27 DIAGNOSIS — Z124 Encounter for screening for malignant neoplasm of cervix: Secondary | ICD-10-CM

## 2013-07-27 DIAGNOSIS — Z0000 Encounter for general adult medical examination without abnormal findings: Secondary | ICD-10-CM

## 2013-07-27 DIAGNOSIS — G4700 Insomnia, unspecified: Secondary | ICD-10-CM

## 2013-07-27 DIAGNOSIS — N390 Urinary tract infection, site not specified: Secondary | ICD-10-CM

## 2013-07-27 DIAGNOSIS — F3289 Other specified depressive episodes: Secondary | ICD-10-CM

## 2013-07-27 DIAGNOSIS — Z01419 Encounter for gynecological examination (general) (routine) without abnormal findings: Secondary | ICD-10-CM

## 2013-07-27 DIAGNOSIS — F329 Major depressive disorder, single episode, unspecified: Secondary | ICD-10-CM

## 2013-07-27 LAB — POCT URINALYSIS DIPSTICK
BILIRUBIN UA: NEGATIVE
Glucose, UA: NEGATIVE
KETONES UA: NEGATIVE
Nitrite, UA: NEGATIVE
PH UA: 5
PROTEIN UA: NEGATIVE
Spec Grav, UA: 1.02
Urobilinogen, UA: NEGATIVE

## 2013-07-27 LAB — POCT UA - MICROSCOPIC ONLY
CASTS, UR, LPF, POC: NEGATIVE
Crystals, Ur, HPF, POC: NEGATIVE
MUCUS UA: NEGATIVE
Yeast, UA: NEGATIVE

## 2013-07-27 MED ORDER — NITROFURANTOIN MONOHYD MACRO 100 MG PO CAPS: 100.0000 mg | ORAL_CAPSULE | Freq: Two times a day (BID) | ORAL | Status: DC

## 2013-07-27 MED ORDER — BUPROPION HCL ER (XL) 150 MG PO TB24: 150.0000 mg | ORAL_TABLET | Freq: Every day | ORAL | Status: DC

## 2013-07-27 MED ORDER — PHENTERMINE HCL 37.5 MG PO TABS: 37.5000 mg | ORAL_TABLET | Freq: Every day | ORAL | Status: DC

## 2013-07-27 MED ORDER — ESCITALOPRAM OXALATE 10 MG PO TABS: 10.0000 mg | ORAL_TABLET | Freq: Every day | ORAL | Status: DC

## 2013-07-27 NOTE — Patient Instructions (Signed)
Calorie Counting Diet A calorie counting diet requires you to eat the number of calories that are right for you in a day. Calories are the measurement of how much energy you get from the food you eat. Eating the right amount of calories is important for staying at a healthy weight. If you eat too many calories, your body will store them as fat and you may gain weight. If you eat too few calories, you may lose weight. Counting the number of calories you eat during a day will help you know if you are eating the right amount. A Registered Dietitian can determine how many calories you need in a day. The amount of calories needed varies from person to person. If your goal is to lose weight, you will need to eat fewer calories. Losing weight can benefit you if you are overweight or have health problems such as heart disease, high blood pressure, or diabetes. If your goal is to gain weight, you will need to eat more calories. Gaining weight may be necessary if you have a certain health problem that causes your body to need more energy. TIPS Whether you are increasing or decreasing the number of calories you eat during a day, it may be hard to get used to changes in what you eat and drink. The following are tips to help you keep track of the number of calories you eat.  Measure foods at home with measuring cups. This helps you know the amount of food and number of calories you are eating.  Restaurants often serve food in amounts that are larger than 1 serving. While eating out, estimate how many servings of a food you are given. For example, a serving of cooked rice is  cup or about the size of half of a fist. Knowing serving sizes will help you be aware of how much food you are eating at restaurants.  Ask for smaller portion sizes or child-size portions at restaurants.  Plan to eat half of a meal at a restaurant. Take the rest home or share the other half with a friend.  Read the Nutrition Facts panel on  food labels for calorie content and serving size. You can find out how many servings are in a package, the size of a serving, and the number of calories each serving has.  For example, a package might contain 3 cookies. The Nutrition Facts panel on that package says that 1 serving is 1 cookie. Below that, it will say there are 3 servings in the container. The calories section of the Nutrition Facts label says there are 90 calories. This means there are 90 calories in 1 cookie (1 serving). If you eat 1 cookie you have eaten 90 calories. If you eat all 3 cookies, you have eaten 270 calories (3 servings x 90 calories = 270 calories). The list below tells you how big or small some common portion sizes are.  1 oz.........4 stacked dice.  3 oz.........Deck of cards.  1 tsp........Tip of little finger.  1 tbs........Thumb.  2 tbs........Golf ball.   cup.......Half of a fist.  1 cup........A fist. KEEP A FOOD LOG Write down every food item you eat, the amount you eat, and the number of calories in each food you eat during the day. At the end of the day, you can add up the total number of calories you have eaten. It may help to keep a list like the one below. Find out the calorie information by reading the   Nutrition Facts panel on food labels. Breakfast  Bran cereal (1 cup, 110 calories).  Fat-free milk ( cup, 45 calories). Snack  Apple (1 medium, 80 calories). Lunch  Spinach (1 cup, 20 calories).  Tomato ( medium, 20 calories).  Chicken breast strips (3 oz, 165 calories).  Shredded cheddar cheese ( cup, 110 calories).  Light Italian dressing (2 tbs, 60 calories).  Whole-wheat bread (1 slice, 80 calories).  Tub margarine (1 tsp, 35 calories).  Vegetable soup (1 cup, 160 calories). Dinner  Pork chop (3 oz, 190 calories).  Brown rice (1 cup, 215 calories).  Steamed broccoli ( cup, 20 calories).  Strawberries (1  cup, 65 calories).  Whipped cream (1 tbs, 50  calories). Daily Calorie Total: 1425 Document Released: 05/13/2005 Document Revised: 08/05/2011 Document Reviewed: 11/07/2006 ExitCare Patient Information 2014 [employer].  

## 2013-07-27 NOTE — Progress Notes (Addendum)
Subjective:    Patient ID: Cathy [name], female    DOB: [birth year], 43 y.o.   MRN: [id number]  HPI  Pt here today for pap smear and breast exam.  She is doing well on current medications.  Pt concerned because weight continues increasing and she feels this makes her asthma and sleep apnea worse.      Review of Systems  Constitutional: Negative for fever, chills, activity change and fatigue.  Respiratory: Negative for shortness of breath.   Cardiovascular: Negative for chest pain and palpitations.  Gastrointestinal: Negative for diarrhea, constipation and blood in stool.  Endocrine: Negative for cold intolerance and heat intolerance.  Genitourinary: Negative for dysuria and frequency.  Skin: Negative for rash.  Neurological: Negative for headaches.  All other systems reviewed and are negative.       Objective:   Physical Exam  Constitutional: She is oriented to person, place, and time. She appears well-developed and well-nourished.  HENT:  Head: Normocephalic.  Right Ear: Hearing, tympanic membrane, external ear and ear canal normal.  Left Ear: Hearing, tympanic membrane, external ear and ear canal normal.  Nose: Nose normal.  Mouth/Throat: Uvula is midline and oropharynx is clear and moist.  Eyes: Conjunctivae and EOM are normal. Pupils are equal, round, and reactive to light.  Neck: Normal range of motion and full passive range of motion without pain. Neck supple. No JVD present. Carotid bruit is not present. No mass and no thyromegaly present.  Cardiovascular: Normal rate, normal heart sounds and intact distal pulses.   No murmur heard. Pulmonary/Chest: Effort normal and breath sounds normal. Right breast exhibits no inverted nipple, no mass, no nipple discharge, no skin change and no tenderness. Left breast exhibits no inverted nipple, no mass, no nipple discharge, no skin change and no tenderness.  Abdominal: Soft. Bowel sounds are normal. She exhibits no mass. There is  no tenderness.  Genitourinary: Vagina normal and uterus normal. No breast swelling, tenderness, discharge or bleeding.  bimanual exam-No adnexal masses or tenderness. Cervix parous and pink- no discharge  Musculoskeletal: Normal range of motion.  Lymphadenopathy:    She has no cervical adenopathy.  Neurological: She is alert and oriented to person, place, and time.  Skin: Skin is warm and dry.  Psychiatric: She has a normal mood and affect. Her behavior is normal. Judgment and thought content normal.   BP 121/84  Pulse 94  Temp(Src) 96.1 F (35.6 C) (Oral)  Ht '5\' 2"'  (1.575 m)  Wt 253 lb (114.76 kg)  BMI 46.26 kg/m2  Results for orders placed in visit on 07/27/13  POCT URINALYSIS DIPSTICK      Result Value Ref Range   Color, UA gold     Clarity, UA clear     Glucose, UA negative     Bilirubin, UA negative     Ketones, UA negative     Spec Grav, UA 1.020     Blood, UA trace     pH, UA 5.0     Protein, UA negative     Urobilinogen, UA negative     Nitrite, UA negative     Leukocytes, UA large (3+)           Assessment & Plan:   1. Annual physical exam   2. Encounter for routine gynecological examination   3. Severe obesity (BMI >= 40)   4. UTI (urinary tract infection)   5. Insomnia   6. DEPRESSION    Orders Placed This Encounter  Procedures  . CMP14+EGFR  . NMR, lipoprofile  . POCT urinalysis dipstick  . POCT UA - Microscopic Only   Meds ordered this encounter  Medications  . phentermine (ADIPEX-P) 37.5 MG tablet    Sig: Take 1 tablet (37.5 mg total) by mouth daily before breakfast.    Dispense:  30 tablet    Refill:  1    Order Specific Question:  Supervising Provider    Answer:  Chipper Herb [[id number]]  . nitrofurantoin, macrocrystal-monohydrate, (MACROBID) 100 MG capsule    Sig: Take 1 capsule (100 mg total) by mouth 2 (two) times daily.    Dispense:  14 capsule    Refill:  0    Order Specific Question:  Supervising Provider    Answer:  Chipper Herb [[id number]]  . escitalopram (LEXAPRO) 10 MG tablet    Sig: Take 1 tablet (10 mg total) by mouth daily.    Dispense:  90 tablet    Refill:  3    Order Specific Question:  Supervising Provider    Answer:  Chipper Herb [[id number]]  . buPROPion (WELLBUTRIN XL) 150 MG 24 hr tablet    Sig: Take 1 tablet (150 mg total) by mouth daily.    Dispense:  90 tablet    Refill:  3    Order Specific Question:  Supervising Provider    Answer:  Chipper Herb [[id number]]    Labs pending Health maintenance reviewed Diet and exercise encouraged- discussed weight loss Continue all meds Follow up  In 1 month to check weight   Mary-Margaret Hassell Done, FNP

## 2013-07-28 ENCOUNTER — Telehealth: Payer: Self-pay | Admitting: Nurse Practitioner

## 2013-07-28 LAB — PAP IG W/ RFLX HPV ASCU: PAP SMEAR COMMENT: 0

## 2013-07-28 MED ORDER — SCOPOLAMINE 1 MG/3DAYS TD PT72: 1.0000 | MEDICATED_PATCH | TRANSDERMAL | Status: DC

## 2013-07-28 NOTE — Telephone Encounter (Signed)
Aware, rx sent in. 

## 2013-07-28 NOTE — Telephone Encounter (Signed)
rx sent to pharmacy

## 2013-07-29 LAB — NMR, LIPOPROFILE
Cholesterol: 198 mg/dL (ref <200)
HDL Cholesterol by NMR: 45 mg/dL (ref >=40)
HDL PARTICLE NUMBER: 26.3 umol/L — AB (ref >=30.5)
LDL Particle Number: 1411 nmol/L — ABNORMAL HIGH (ref <1000)
LDL SIZE: 21.1 nm (ref >20.5)
LDLC SERPL CALC-MCNC: 125 mg/dL — ABNORMAL HIGH (ref <100)
LP-IR SCORE: 54 — AB (ref <=45)
SMALL LDL PARTICLE NUMBER: 562 nmol/L — AB (ref <=527)
Triglycerides by NMR: 141 mg/dL (ref <150)

## 2013-07-29 LAB — CMP14+EGFR
A/G RATIO: 1.2 (ref 1.1–2.5)
ALK PHOS: 112 IU/L (ref 39–117)
ALT: 18 IU/L (ref 0–32)
AST: 18 IU/L (ref 0–40)
Albumin: 4.2 g/dL (ref 3.5–5.5)
BILIRUBIN TOTAL: 0.4 mg/dL (ref 0.0–1.2)
BUN / CREAT RATIO: 16 (ref 9–23)
BUN: 13 mg/dL (ref 6–24)
CALCIUM: 8.8 mg/dL (ref 8.7–10.2)
CO2: 21 mmol/L (ref 18–29)
CREATININE: 0.82 mg/dL (ref 0.57–1.00)
Chloride: 101 mmol/L (ref 97–108)
GFR calc Af Amer: 101 mL/min/{1.73_m2} (ref >59)
GFR, EST NON AFRICAN AMERICAN: 87 mL/min/{1.73_m2} (ref >59)
GLOBULIN, TOTAL: 3.4 g/dL (ref 1.5–4.5)
Glucose: 94 mg/dL (ref 65–99)
Potassium: 4.8 mmol/L (ref 3.5–5.2)
SODIUM: 139 mmol/L (ref 134–144)
Total Protein: 7.6 g/dL (ref 6.0–8.5)

## 2013-07-30 ENCOUNTER — Telehealth: Payer: Self-pay | Admitting: *Deleted

## 2013-07-30 NOTE — Telephone Encounter (Signed)
Message copied by Almeta MonasSTONE, Amilyah Nack M on Fri Jul 30, 2013  4:06 PM ------      Message from: Bennie PieriniMARTIN, MARY-MARGARET      Created: Thu Jul 29, 2013 10:53 AM       uti treated at appointment      PAP normal- repeat in 2 years      Kidney and liver function stable      Dl elevated- low CVA risk- should improve with diet and exercise.      Recheck in 3 months ------

## 2013-07-30 NOTE — Telephone Encounter (Signed)
Aware of results. 

## 2013-08-18 ENCOUNTER — Ambulatory Visit (INDEPENDENT_AMBULATORY_CARE_PROVIDER_SITE_OTHER): Payer: BC Managed Care – PPO | Admitting: General Practice

## 2013-08-18 VITALS — BP 128/87 | HR 82 | Temp 98.7°F | Ht 62.5 in | Wt 250.0 lb

## 2013-08-18 DIAGNOSIS — J029 Acute pharyngitis, unspecified: Secondary | ICD-10-CM

## 2013-08-18 DIAGNOSIS — R059 Cough, unspecified: Secondary | ICD-10-CM

## 2013-08-18 DIAGNOSIS — J020 Streptococcal pharyngitis: Secondary | ICD-10-CM

## 2013-08-18 DIAGNOSIS — R05 Cough: Secondary | ICD-10-CM

## 2013-08-18 LAB — POCT RAPID STREP A (OFFICE): Rapid Strep A Screen: POSITIVE — AB

## 2013-08-18 MED ORDER — AMOXICILLIN 875 MG PO TABS: 875.0000 mg | ORAL_TABLET | Freq: Two times a day (BID) | ORAL | Status: DC

## 2013-08-18 MED ORDER — BENZONATATE 100 MG PO CAPS: 100.0000 mg | ORAL_CAPSULE | Freq: Three times a day (TID) | ORAL | Status: DC | PRN

## 2013-08-18 NOTE — Progress Notes (Signed)
   Subjective:    Patient ID: Cathy [name], female    DOB: November 30, 1970, 43 y.o.   MRN: [id number]  Cough This is a new problem. The current episode started in the past 7 days. The problem has been gradually worsening. The problem occurs every few minutes. The cough is non-productive. Associated symptoms include nasal congestion, postnasal drip and a sore throat. Pertinent negatives include no chest pain, chills, shortness of breath or wheezing. The symptoms are aggravated by lying down. Cathy [name] has tried a beta-agonist inhaler and OTC cough suppressant for the symptoms. There is no history of asthma or bronchitis.  Sore Throat  This is a new problem. The current episode started in the past 7 days. The problem has been unchanged. There has been no fever. The pain is at a severity of 6/10. Associated symptoms include congestion. Pertinent negatives include no shortness of breath. Cathy [name] has tried acetaminophen for the symptoms.      Review of Systems  Constitutional: Negative for chills.  HENT: Positive for congestion, postnasal drip, sinus pressure and sore throat.   Respiratory: Negative for chest tightness, shortness of breath and wheezing.   Cardiovascular: Negative for chest pain and palpitations.       Objective:   Physical Exam  Constitutional: Cathy [name] is oriented to person, place, and time. Cathy [name] appears well-developed and well-nourished.  HENT:  Head: Normocephalic and atraumatic.  Right Ear: External ear normal.  Left Ear: External ear normal.  Mouth/Throat: Posterior oropharyngeal erythema present.  Eyes: Conjunctivae are normal. Pupils are equal, round, and reactive to light.  Pulmonary/Chest: Effort normal and breath sounds normal. No respiratory distress. Cathy [name] exhibits no tenderness.  Neurological: Cathy [name] is alert and oriented to person, place, and time.  Skin: Skin is warm and dry.  Psychiatric: Cathy [name] has a normal mood and affect.     Results for orders placed in visit on 08/18/13    POCT RAPID STREP A (OFFICE)      Result Value Ref Range   Rapid Strep A Screen Positive (*) Negative        Assessment & Plan:  1. Sore throat  - POCT rapid strep A  2. Streptococcal sore throat  - amoxicillin (AMOXIL) 875 MG tablet; Take 1 tablet (875 mg total) by mouth 2 (two) times daily.  Dispense: 20 tablet; Refill: 0  3. Cough  - benzonatate (TESSALON) 100 MG capsule; Take 1 capsule (100 mg total) by mouth 3 (three) times daily as needed.  Dispense: 30 capsule; Refill: 0 Increase fluid intake Motrin or tylenol OTC as directed Throat lozenges if help New toothbrush in 3 days Proper handwashing Patient verbalized understanding Coralie KeensMae E. Kerrigan Gombos, FNP-C

## 2013-08-18 NOTE — Patient Instructions (Signed)
Strep Throat  Strep throat is an infection of the throat caused by a bacteria named Streptococcus pyogenes. Your caregiver may call the infection streptococcal "tonsillitis" or "pharyngitis" depending on whether there are signs of inflammation in the tonsils or back of the throat. Strep throat is most common in children aged 45 15 years during the cold months of the year, but it can occur in people of any age during any season. This infection is spread from person to person (contagious) through coughing, sneezing, or other close contact.  SYMPTOMS   · Fever or chills.  · Painful, swollen, red tonsils or throat.  · Pain or difficulty when swallowing.  · White or yellow spots on the tonsils or throat.  · Swollen, tender lymph nodes or "glands" of the neck or under the jaw.  · Red rash all over the body (rare).  DIAGNOSIS   Many different infections can cause the same symptoms. A test must be done to confirm the diagnosis so the right treatment can be given. A "rapid strep test" can help your caregiver make the diagnosis in a few minutes. If this test is not available, a light swab of the infected area can be used for a throat culture test. If a throat culture test is done, results are usually available in a day or two.  TREATMENT   Strep throat is treated with antibiotic medicine.  HOME CARE INSTRUCTIONS   · Gargle with 1 tsp of salt in 1 cup of warm water, 3 4 times per day or as needed for comfort.  · Family members who also have a sore throat or fever should be tested for strep throat and treated with antibiotics if they have the strep infection.  · Make sure everyone in your household washes their hands well.  · Do not share food, drinking cups, or personal items that could cause the infection to spread to others.  · You may need to eat a soft food diet until your sore throat gets better.  · Drink enough water and fluids to keep your urine clear or pale yellow. This will help prevent dehydration.  · Get plenty of  rest.  · Stay home from school, daycare, or work until you have been on antibiotics for 24 hours.  · Only take over-the-counter or prescription medicines for pain, discomfort, or fever as directed by your caregiver.  · If antibiotics are prescribed, take them as directed. Finish them even if you start to feel better.  SEEK MEDICAL CARE IF:   · The glands in your neck continue to enlarge.  · You develop a rash, cough, or earache.  · You cough up green, yellow-brown, or bloody sputum.  · You have pain or discomfort not controlled by medicines.  · Your problems seem to be getting worse rather than better.  SEEK IMMEDIATE MEDICAL CARE IF:   · You develop any new symptoms such as vomiting, severe headache, stiff or painful neck, chest pain, shortness of breath, or trouble swallowing.  · You develop severe throat pain, drooling, or changes in your voice.  · You develop swelling of the neck, or the skin on the neck becomes red and tender.  · You have a fever.  · You develop signs of dehydration, such as fatigue, dry mouth, and decreased urination.  · You become increasingly sleepy, or you cannot wake up completely.  Document Released: 05/10/2000 Document Revised: 04/29/2012 Document Reviewed: 07/12/2010  ExitCare® Patient Information ©2014 [employer].

## 2013-08-20 ENCOUNTER — Telehealth: Payer: Self-pay | Admitting: General Practice

## 2013-08-20 ENCOUNTER — Other Ambulatory Visit: Payer: Self-pay | Admitting: General Practice

## 2013-08-20 NOTE — Telephone Encounter (Signed)
Patient has script for sleep aid. She may need to be seen if OTC cough med not effective.

## 2013-08-20 NOTE — Telephone Encounter (Signed)
i called [facility] and they have some so rx was called into [facility] and patient aware.

## 2013-08-21 NOTE — Telephone Encounter (Signed)
Script filled by another pharmacy

## 2013-08-24 ENCOUNTER — Other Ambulatory Visit: Payer: Self-pay | Admitting: General Practice

## 2013-09-02 ENCOUNTER — Telehealth: Payer: Self-pay | Admitting: *Deleted

## 2013-09-02 ENCOUNTER — Telehealth: Payer: Self-pay | Admitting: Nurse Practitioner

## 2013-09-02 MED ORDER — BUDESONIDE-FORMOTEROL FUMARATE 80-4.5 MCG/ACT IN AERO: 2.0000 | INHALATION_SPRAY | Freq: Two times a day (BID) | RESPIRATORY_TRACT | Status: DC

## 2013-09-02 NOTE — Telephone Encounter (Signed)
Pt states she "lost rx" and would like for MMM to write a new rx and says she is ok if MMM would change the rx to "something different" since she is afraid insurance will not pay again for same med

## 2013-09-02 NOTE — Telephone Encounter (Signed)
Received fax from [facility] requesting symbicort [id number] AER. Do not see on current med list. Please advise

## 2013-09-02 NOTE — Telephone Encounter (Signed)
They will not fill until it has been 30 days unless you paid cash for it.

## 2013-09-02 NOTE — Telephone Encounter (Signed)
prescription for for symbicort sent to pharmacy

## 2013-09-03 ENCOUNTER — Telehealth: Payer: Self-pay | Admitting: *Deleted

## 2013-09-03 MED ORDER — MOMETASONE FURO-FORMOTEROL FUM 100-5 MCG/ACT IN AERO: 2.0000 | INHALATION_SPRAY | Freq: Every day | RESPIRATORY_TRACT | Status: DC

## 2013-09-03 NOTE — Telephone Encounter (Signed)
Per [facility] Symbicort that was sent in yesterday is not covered by patients insurance. Advair and Duleranon are on her formulary. Do you want to change? Please advise

## 2013-09-14 ENCOUNTER — Other Ambulatory Visit: Payer: Self-pay | Admitting: Family Medicine

## 2013-09-16 ENCOUNTER — Other Ambulatory Visit: Payer: Self-pay | Admitting: Internal Medicine

## 2013-10-05 ENCOUNTER — Other Ambulatory Visit: Payer: Self-pay

## 2013-10-05 NOTE — Telephone Encounter (Signed)
ntbs to check weight before getting refill on adipex

## 2013-10-05 NOTE — Telephone Encounter (Signed)
Last seen 07/27/13  MMM If approved print and route to nurse

## 2013-10-08 ENCOUNTER — Other Ambulatory Visit: Payer: Self-pay | Admitting: *Deleted

## 2013-10-08 NOTE — Telephone Encounter (Signed)
ntbs to check weight for adipex refill

## 2013-10-08 NOTE — Telephone Encounter (Signed)
Last filled 08/24/13, last seen 07/27/13,

## 2013-10-12 ENCOUNTER — Telehealth: Payer: Self-pay | Admitting: Nurse Practitioner

## 2013-10-12 NOTE — Telephone Encounter (Signed)
I have responded to this 2 times already- Cannot have refill without being seen

## 2013-10-12 NOTE — Telephone Encounter (Signed)
Patient has an appointment aware

## 2013-10-14 ENCOUNTER — Encounter: Payer: Self-pay | Admitting: Nurse Practitioner

## 2013-10-14 ENCOUNTER — Ambulatory Visit (INDEPENDENT_AMBULATORY_CARE_PROVIDER_SITE_OTHER): Payer: BC Managed Care – PPO | Admitting: Nurse Practitioner

## 2013-10-14 DIAGNOSIS — R635 Abnormal weight gain: Secondary | ICD-10-CM

## 2013-10-14 DIAGNOSIS — R634 Abnormal weight loss: Secondary | ICD-10-CM

## 2013-10-14 MED ORDER — PHENTERMINE HCL 37.5 MG PO TABS: 37.5000 mg | ORAL_TABLET | Freq: Every day | ORAL | Status: DC

## 2013-10-14 NOTE — Patient Instructions (Signed)
[facility] [place]

## 2013-10-14 NOTE — Progress Notes (Signed)
   Subjective:    Patient ID: Cathy [name], female    DOB: 07-Aug-1970, 43 y.o.   MRN: [id number]  HPI Patient in for recheck of weight- SHe has been on adipex for 2 months- SHe has lost 5 lbs- SHe has been out since end of April. SHe has been trying to exercise.    Review of Systems  Constitutional: Negative.   HENT: Negative.   Respiratory: Negative.   Cardiovascular: Negative.   Genitourinary: Negative.   Hematological: Negative.   Psychiatric/Behavioral: Negative.   All other systems reviewed and are negative.      Objective:   Physical Exam  Constitutional: She is oriented to person, place, and time. She appears well-developed and well-nourished.  Cardiovascular: Normal rate, regular rhythm and normal heart sounds.   Pulmonary/Chest: Effort normal and breath sounds normal.  Abdominal: Soft. Bowel sounds are normal.  Neurological: She is alert and oriented to person, place, and time.  Skin: Skin is warm and dry.  Psychiatric: She has a normal mood and affect. Her behavior is normal. Judgment and thought content normal.   BP 113/72  Pulse 87  Temp(Src) 98.3 F (36.8 C) (Oral)  Ht 5' 2.5" (1.588 m)  Wt 253 lb (114.76 kg)  BMI 45.51 kg/m2        Assessment & Plan:   1. Severe obesity (BMI >= 40)   2. Weight loss    Meds ordered this encounter  Medications  . phentermine (ADIPEX-P) 37.5 MG tablet    Sig: Take 1 tablet (37.5 mg total) by mouth daily before breakfast.    Dispense:  30 tablet    Refill:  1    Order Specific Question:  Supervising Provider    Answer:  Deborra MedinaMOORE, DONALD W [[id number]]   Diet and exercise discussed Talked about advocare and plexus- patient will check into these Follow up in 3 months  [name], FNP

## 2014-01-14 ENCOUNTER — Ambulatory Visit (INDEPENDENT_AMBULATORY_CARE_PROVIDER_SITE_OTHER): Payer: BC Managed Care – PPO | Admitting: Family

## 2014-01-14 ENCOUNTER — Encounter (INDEPENDENT_AMBULATORY_CARE_PROVIDER_SITE_OTHER): Payer: Self-pay

## 2014-01-14 ENCOUNTER — Encounter: Payer: Self-pay | Admitting: Family

## 2014-01-14 ENCOUNTER — Ambulatory Visit (INDEPENDENT_AMBULATORY_CARE_PROVIDER_SITE_OTHER): Payer: BC Managed Care – PPO

## 2014-01-14 VITALS — BP 137/92 | HR 80 | Temp 97.6°F | Ht 62.5 in | Wt 250.0 lb

## 2014-01-14 DIAGNOSIS — M7989 Other specified soft tissue disorders: Secondary | ICD-10-CM

## 2014-01-14 DIAGNOSIS — S93609A Unspecified sprain of unspecified foot, initial encounter: Secondary | ICD-10-CM

## 2014-01-14 DIAGNOSIS — M79609 Pain in unspecified limb: Secondary | ICD-10-CM

## 2014-01-14 DIAGNOSIS — G4700 Insomnia, unspecified: Secondary | ICD-10-CM

## 2014-01-14 DIAGNOSIS — F329 Major depressive disorder, single episode, unspecified: Secondary | ICD-10-CM

## 2014-01-14 DIAGNOSIS — M79674 Pain in right toe(s): Secondary | ICD-10-CM

## 2014-01-14 DIAGNOSIS — F3289 Other specified depressive episodes: Secondary | ICD-10-CM

## 2014-01-14 DIAGNOSIS — S93601A Unspecified sprain of right foot, initial encounter: Secondary | ICD-10-CM

## 2014-01-14 MED ORDER — TEMAZEPAM 15 MG PO CAPS: ORAL_CAPSULE | ORAL | Status: AC

## 2014-01-14 MED ORDER — MELOXICAM 15 MG PO TABS: 15.0000 mg | ORAL_TABLET | Freq: Every day | ORAL | Status: DC

## 2014-01-14 NOTE — Patient Instructions (Signed)
Foot Sprain The muscles and cord like structures which attach muscle to bone (tendons) that surround the feet are made up of units. A foot sprain can occur at the weakest spot in any of these units. This condition is most often caused by injury to or overuse of the foot, as from playing contact sports, or aggravating a previous injury, or from poor conditioning, or obesity. SYMPTOMS  Pain with movement of the foot.  Tenderness and swelling at the injury site.  Loss of strength is present in moderate or severe sprains. THE THREE GRADES OR SEVERITY OF FOOT SPRAIN ARE:  Mild (Grade I): Slightly pulled muscle without tearing of muscle or tendon fibers or loss of strength.  Moderate (Grade II): Tearing of fibers in a muscle, tendon, or at the attachment to bone, with small decrease in strength.  Severe (Grade III): Rupture of the muscle-tendon-bone attachment, with separation of fibers. Severe sprain requires surgical repair. Often repeating (chronic) sprains are caused by overuse. Sudden (acute) sprains are caused by direct injury or over-use. DIAGNOSIS  Diagnosis of this condition is usually by your own observation. If problems continue, a caregiver may be required for further evaluation and treatment. X-rays may be required to make sure there are not breaks in the bones (fractures) present. Continued problems may require physical therapy for treatment. PREVENTION  Use strength and conditioning exercises appropriate for your sport.  Warm up properly prior to working out.  Use athletic shoes that are made for the sport you are participating in.  Allow adequate time for healing. Early return to activities makes repeat injury more likely, and can lead to an unstable arthritic foot that can result in prolonged disability. Mild sprains generally heal in 3 to 10 days, with moderate and severe sprains taking 2 to 10 weeks. Your caregiver can help you determine the proper time required for  healing. HOME CARE INSTRUCTIONS   Apply ice to the injury for 15-20 minutes, 03-04 times per day. Put the ice in a plastic bag and place a towel between the bag of ice and your skin.  An elastic wrap (like an Ace bandage) may be used to keep swelling down.  Keep foot above the level of the heart, or at least raised on a footstool, when swelling and pain are present.  Try to avoid use other than gentle range of motion while the foot is painful. Do not resume use until instructed by your caregiver. Then begin use gradually, not increasing use to the point of pain. If pain does develop, decrease use and continue the above measures, gradually increasing activities that do not cause discomfort, until you gradually achieve normal use.  Use crutches if and as instructed, and for the length of time instructed.  Keep injured foot and ankle wrapped between treatments.  Massage foot and ankle for comfort and to keep swelling down. Massage from the toes up towards the knee.  Only take over-the-counter or prescription medicines for pain, discomfort, or fever as directed by your caregiver. SEEK IMMEDIATE MEDICAL CARE IF:   Your pain and swelling increase, or pain is not controlled with medications.  You have loss of feeling in your foot or your foot turns cold or blue.  You develop new, unexplained symptoms, or an increase of the symptoms that brought you to your caregiver. MAKE SURE YOU:   Understand these instructions.  Will watch your condition.  Will get help right away if you are not doing well or get worse. Document Released:   11/02/2001 Document Revised: 08/05/2011 Document Reviewed: 12/31/2007 Exit[employer] Patient Information 2015 [employer]. This information is not intended to replace advice given to you by your health care provider. Make sure you discuss any questions you have with your health care provider.  

## 2014-01-14 NOTE — Progress Notes (Signed)
   Subjective:    Patient ID: Cathy [name], female    DOB: 08-14-1970, 43 y.o.   MRN: [id number]  HPI Pt presents to the office for right foot swelling. Pt states it started on Wednesday. She has tried ice, elevation, and motrin with moderate relief. Pt states that walking on the foot makes the pain worse. States the pain is a constant aching of  5 out 10. Pt can not recall  Any injury to the her foot.    Review of Systems  Constitutional: Negative.   HENT: Negative.   Eyes: Negative.   Respiratory: Negative.  Negative for shortness of breath.   Cardiovascular: Negative.  Negative for palpitations.  Gastrointestinal: Negative.   Endocrine: Negative.   Genitourinary: Negative.   Musculoskeletal: Positive for gait problem.  Neurological: Negative.  Negative for headaches.  Hematological: Negative.   Psychiatric/Behavioral: Negative.   All other systems reviewed and are negative.      Objective:   Physical Exam  Vitals reviewed. Constitutional: She is oriented to person, place, and time. She appears well-developed and well-nourished. No distress.  Cardiovascular: Normal rate, regular rhythm, normal heart sounds and intact distal pulses.   No murmur heard. Pulmonary/Chest: Effort normal and breath sounds normal. No respiratory distress. She has no wheezes.  Abdominal: Soft. Bowel sounds are normal. She exhibits no distension. There is no tenderness.  Musculoskeletal: Normal range of motion. She exhibits edema (trace amt in right foot) and tenderness (mild amt).  Full ROM of foot , bilateral pulses 2+  Neurological: She is alert and oriented to person, place, and time. She has normal reflexes. No cranial nerve deficit.  Skin: Skin is warm and dry.  Psychiatric: She has a normal mood and affect. Her behavior is normal. Judgment and thought content normal.    BP 137/92  Pulse 80  Temp(Src) 97.6 F (36.4 C) (Oral)  Ht 5' 2.5" (1.588 m)  Wt 250 lb (113.399 kg)  BMI 44.97  kg/m2       Assessment & Plan:  1. Pain and swelling of toe of right foot - DG Foot Complete Right; Future  2. Right foot sprain, initial encounter -Rest -Ice -Elevation -No other NSAID's, tylenol prn - meloxicam (MOBIC) 15 MG tablet; Take 1 tablet (15 mg total) by mouth daily.  Dispense: 30 tablet; Refill: 0  Jannifer Rodneyhristy Kylie Simmonds, FNP

## 2014-02-10 ENCOUNTER — Ambulatory Visit (INDEPENDENT_AMBULATORY_CARE_PROVIDER_SITE_OTHER): Payer: BC Managed Care – PPO | Admitting: Family Medicine

## 2014-02-10 ENCOUNTER — Encounter: Payer: Self-pay | Admitting: Family Medicine

## 2014-02-10 VITALS — BP 127/87 | HR 91 | Temp 96.1°F | Ht 62.5 in | Wt 252.6 lb

## 2014-02-10 DIAGNOSIS — J020 Streptococcal pharyngitis: Secondary | ICD-10-CM

## 2014-02-10 LAB — POCT RAPID STREP A (OFFICE): Rapid Strep A Screen: NEGATIVE

## 2014-02-10 MED ORDER — TRIAMCINOLONE ACETONIDE 40 MG/ML IJ SUSP
60.0000 mg | Freq: Once | INTRAMUSCULAR | Status: AC
Start: 2014-02-10 — End: 2014-02-10
  Administered 2014-02-10: 60 mg via INTRAMUSCULAR

## 2014-02-10 MED ORDER — AZITHROMYCIN 250 MG PO TABS: ORAL_TABLET | ORAL | Status: DC

## 2014-02-10 NOTE — Progress Notes (Signed)
   Subjective:    Patient ID: ALIKA EPPES, female    DOB: [birth year], 43 y.o.   MRN: [id number]  HPI This 43 y.o. female presents for evaluation of URI sx's sore throat and bilateral ear discomfort.   Review of Systems C/o bilateral ear discomfort   No chest pain, SOB, HA, dizziness, vision change, N/V, diarrhea, constipation, dysuria, urinary urgency or frequency, myalgias, arthralgias or rash.  Objective:   Physical Exam  Vital signs noted  Well developed well nourished female.  HEENT - Head atraumatic Normocephalic                Eyes - PERRLA, Conjuctiva - clear Sclera- Clear EOMI                Ears - EAC's Wnl TM's Wnl Gross Hearing WNL                Throat - oropharanx wnl Respiratory - Lungs CTA bilateral Cardiac - RRR S1 and S2 without murmur GI - Abdomen soft Nontender and bowel sounds active x 4 Extremities - No edema. Neuro - Grossly intact.  Results for orders placed in visit on 02/10/14  POCT RAPID STREP A (OFFICE)      Result Value Ref Range   Rapid Strep A Screen Negative  Negative      Assessment & Plan:  Streptococcal sore throat - Plan: POCT rapid strep A, azithromycin (ZITHROMAX) 250 MG tablet, triamcinolone acetonide (KENALOG-40) injection 60 mg  Push po fluids, rest, tylenol and motrin otc prn as directed for fever, arthralgias, and myalgias.  Follow up prn if sx's continue or persist.  Deatra Canter FNP

## 2014-03-15 ENCOUNTER — Other Ambulatory Visit: Payer: Self-pay | Admitting: *Deleted

## 2014-03-15 MED ORDER — MOMETASONE FURO-FORMOTEROL FUM 100-5 MCG/ACT IN AERO: 2.0000 | INHALATION_SPRAY | Freq: Every day | RESPIRATORY_TRACT | Status: AC

## 2014-03-15 NOTE — Telephone Encounter (Signed)
Last ov 8/15. Last refill 4/15. Please review.

## 2014-08-10 ENCOUNTER — Ambulatory Visit (INDEPENDENT_AMBULATORY_CARE_PROVIDER_SITE_OTHER): Payer: BLUE CROSS/BLUE SHIELD | Admitting: Family

## 2014-08-10 ENCOUNTER — Encounter: Payer: Self-pay | Admitting: Family

## 2014-08-10 VITALS — BP 128/93 | HR 106 | Temp 97.2°F | Ht 62.5 in | Wt 262.6 lb

## 2014-08-10 DIAGNOSIS — J069 Acute upper respiratory infection, unspecified: Secondary | ICD-10-CM

## 2014-08-10 MED ORDER — HYDROCODONE-GUAIFENESIN 2.5-200 MG/5ML PO SOLN: 10.0000 mL | Freq: Four times a day (QID) | ORAL | Status: DC | PRN

## 2014-08-10 NOTE — Patient Instructions (Signed)
Upper Respiratory Infection, Adult An upper respiratory infection (URI) is also sometimes known as the common cold. The upper respiratory tract includes the nose, sinuses, throat, trachea, and bronchi. Bronchi are the airways leading to the lungs. Most people improve within 1 week, but symptoms can last up to 2 weeks. A residual cough may last even longer.  CAUSES Many different viruses can infect the tissues lining the upper respiratory tract. The tissues become irritated and inflamed and often become very moist. Mucus production is also common. A cold is contagious. You can easily spread the virus to others by oral contact. This includes kissing, sharing a glass, coughing, or sneezing. Touching your mouth or nose and then touching a surface, which is then touched by another person, can also spread the virus. SYMPTOMS  Symptoms typically develop 1 to 3 days after you come in contact with a cold virus. Symptoms vary from person to person. They may include:  Runny nose.  Sneezing.  Nasal congestion.  Sinus irritation.  Sore throat.  Loss of voice (laryngitis).  Cough.  Fatigue.  Muscle aches.  Loss of appetite.  Headache.  Low-grade fever. DIAGNOSIS  You might diagnose your own cold based on familiar symptoms, since most people get a cold 2 to 3 times a year. Your caregiver can confirm this based on your exam. Most importantly, your caregiver can check that your symptoms are not due to another disease such as strep throat, sinusitis, pneumonia, asthma, or epiglottitis. Blood tests, throat tests, and X-rays are not necessary to diagnose a common cold, but they may sometimes be helpful in excluding other more serious diseases. Your caregiver will decide if any further tests are required. RISKS AND COMPLICATIONS  You may be at risk for a more severe case of the common cold if you smoke cigarettes, have chronic heart disease (such as heart failure) or lung disease (such as asthma), or if  you have a weakened immune system. The very young and very old are also at risk for more serious infections. Bacterial sinusitis, middle ear infections, and bacterial pneumonia can complicate the common cold. The common cold can worsen asthma and chronic obstructive pulmonary disease (COPD). Sometimes, these complications can require emergency medical care and may be life-threatening. PREVENTION  The best way to protect against getting a cold is to practice good hygiene. Avoid oral or hand contact with people with cold symptoms. Wash your hands often if contact occurs. There is no clear evidence that vitamin C, vitamin E, echinacea, or exercise reduces the chance of developing a cold. However, it is always recommended to get plenty of rest and practice good nutrition. TREATMENT  Treatment is directed at relieving symptoms. There is no cure. Antibiotics are not effective, because the infection is caused by a virus, not by bacteria. Treatment may include:  Increased fluid intake. Sports drinks offer valuable electrolytes, sugars, and fluids.  Breathing heated mist or steam (vaporizer or shower).  Eating chicken soup or other clear broths, and maintaining good nutrition.  Getting plenty of rest.  Using gargles or lozenges for comfort.  Controlling fevers with ibuprofen or acetaminophen as directed by your caregiver.  Increasing usage of your inhaler if you have asthma. Zinc gel and zinc lozenges, taken in the first 24 hours of the common cold, can shorten the duration and lessen the severity of symptoms. Pain medicines may help with fever, muscle aches, and throat pain. A variety of non-prescription medicines are available to treat congestion and runny nose. Your caregiver   can make recommendations and may suggest nasal or lung inhalers for other symptoms.  HOME CARE INSTRUCTIONS   Only take over-the-counter or prescription medicines for pain, discomfort, or fever as directed by your  caregiver.  Use a warm mist humidifier or inhale steam from a shower to increase air moisture. This may keep secretions moist and make it easier to breathe.  Drink enough water and fluids to keep your urine clear or pale yellow.  Rest as needed.  Return to work when your temperature has returned to normal or as your caregiver advises. You may need to stay home longer to avoid infecting others. You can also use a face mask and careful hand washing to prevent spread of the virus. SEEK MEDICAL CARE IF:   After the first few days, you feel you are getting worse rather than better.  You need your caregiver's advice about medicines to control symptoms.  You develop chills, worsening shortness of breath, or brown or red sputum. These may be signs of pneumonia.  You develop yellow or brown nasal discharge or pain in the face, especially when you bend forward. These may be signs of sinusitis.  You develop a fever, swollen neck glands, pain with swallowing, or white areas in the back of your throat. These may be signs of strep throat. SEEK IMMEDIATE MEDICAL CARE IF:   You have a fever.  You develop severe or persistent headache, ear pain, sinus pain, or chest pain.  You develop wheezing, a prolonged cough, cough up blood, or have a change in your usual mucus (if you have chronic lung disease).  You develop sore muscles or a stiff neck. Document Released: 11/06/2000 Document Revised: 08/05/2011 Document Reviewed: 08/18/2013 [employer] Patient Information 2015 [employer]. This information is not intended to replace advice given to you by your health care provider. Make sure you discuss any questions you have with your health care provider.  

## 2014-08-10 NOTE — Progress Notes (Signed)
Subjective:    Patient ID: Cathy [name], female    DOB: [birth year], 44 y.o.   MRN: [id number]  Fever  Associated symptoms include coughing, ear pain, headaches, a sore throat and wheezing.  Cough This is a new problem. The current episode started in the past 7 days (Saturday). The problem has been waxing and waning. The problem occurs every few minutes. The cough is productive of purulent sputum. Associated symptoms include chills, ear congestion, ear pain, a fever, headaches, myalgias, nasal congestion, postnasal drip, rhinorrhea, a sore throat, shortness of breath and wheezing. She has tried rest (Tamiful, flonase, Psychiatristtessalon ) for the symptoms. The treatment provided mild relief. There is no history of asthma or COPD.  Headache  Associated symptoms include coughing, ear pain, a fever, rhinorrhea and a sore throat.    Pt was seen at the [facility] and was negative for flu, strep, and mono on Monday. Pt was started on Tamiful  Review of Systems  Constitutional: Positive for fever and chills.  HENT: Positive for ear pain, postnasal drip, rhinorrhea and sore throat.   Eyes: Negative.   Respiratory: Positive for cough, shortness of breath and wheezing.   Cardiovascular: Negative.  Negative for palpitations.  Gastrointestinal: Negative.   Endocrine: Negative.   Genitourinary: Negative.   Musculoskeletal: Positive for myalgias.  Neurological: Positive for headaches.  Hematological: Negative.   Psychiatric/Behavioral: Negative.   All other systems reviewed and are negative.      Objective:   Physical Exam  Constitutional: She is oriented to person, place, and time. She appears well-developed and well-nourished. No distress.  Pt looks ill   HENT:  Head: Normocephalic and atraumatic.  Right Ear: External ear normal.  Left Ear: External ear normal.  Mouth/Throat: Oropharynx is clear and moist.  Eyes: Pupils are equal, round, and reactive to light.  Neck: Normal range of  motion. Neck supple. No thyromegaly present.  Cardiovascular: Normal rate, regular rhythm, normal heart sounds and intact distal pulses.   No murmur heard. Pulmonary/Chest: Effort normal and breath sounds normal. No respiratory distress. She has no wheezes.  SOB of exertion   Abdominal: Soft. Bowel sounds are normal. She exhibits no distension. There is no tenderness.  Musculoskeletal: Normal range of motion. She exhibits no edema or tenderness.  Neurological: She is alert and oriented to person, place, and time. She has normal reflexes. No cranial nerve deficit.  Skin: Skin is warm and dry.  Psychiatric: She has a normal mood and affect. Her behavior is normal. Judgment and thought content normal.  Vitals reviewed.     BP 153/108 mmHg  Pulse 106  Temp(Src) 97.2 F (36.2 C) (Oral)  Ht 5' 2.5" (1.588 m)  Wt 262 lb 9.6 oz (119.115 kg)  BMI 47.24 kg/m2     Assessment & Plan:  1. Acute upper respiratory infection -- Take meds as prescribed - Use a cool mist humidifier  -Use saline nose sprays frequently -Saline irrigations of the nose can be very helpful if done frequently.  * 4X daily for 1 week*  * Use of a nettie pot can be helpful with this. Follow directions with this* -Force fluids -For fever or aces or pains- take tylenol or ibuprofen appropriate for age and weight.  * for fevers greater than 101 orally you may alternate ibuprofen and tylenol every  3 hours. -Throat lozenges if help - Hydrocodone-Guaifenesin (FLOWTUSS) 2.5-200 MG/5ML SOLN; Take 10 mLs by mouth every 6 (six) hours as needed.  Dispense: 200 mL;  Refill: 0  Evelina Dun, FNP

## 2014-08-25 ENCOUNTER — Other Ambulatory Visit: Payer: Self-pay | Admitting: Nurse Practitioner

## 2014-09-09 ENCOUNTER — Other Ambulatory Visit: Payer: Self-pay | Admitting: Nurse Practitioner

## 2014-11-27 ENCOUNTER — Other Ambulatory Visit: Payer: Self-pay | Admitting: Family

## 2014-11-29 NOTE — Telephone Encounter (Signed)
Last seen 08/10/14 Cathy [name]  Requesting 90 day supply

## 2015-03-06 ENCOUNTER — Other Ambulatory Visit: Payer: Self-pay | Admitting: Family

## 2015-05-28 HISTORY — PX: LEG SURGERY: SHX1003

## 2015-06-03 ENCOUNTER — Other Ambulatory Visit: Payer: Self-pay | Admitting: Family

## 2015-06-05 NOTE — Telephone Encounter (Signed)
Pt needs appt for chronic follow up!!! 

## 2015-06-05 NOTE — Telephone Encounter (Signed)
[facility]  [facility]

## 2015-06-05 NOTE — Telephone Encounter (Signed)
Patient aware.

## 2015-06-15 ENCOUNTER — Other Ambulatory Visit: Payer: Self-pay | Admitting: Family

## 2015-06-15 NOTE — Telephone Encounter (Signed)
Last seen 08/10/14  [facility]

## 2015-06-15 NOTE — Telephone Encounter (Signed)
Patient has appt 1/26

## 2015-06-15 NOTE — Telephone Encounter (Signed)
PT needs chronic follow up 

## 2015-06-22 ENCOUNTER — Ambulatory Visit (INDEPENDENT_AMBULATORY_CARE_PROVIDER_SITE_OTHER): Payer: 59 | Admitting: Nurse Practitioner

## 2015-06-22 ENCOUNTER — Encounter: Payer: Self-pay | Admitting: Nurse Practitioner

## 2015-06-22 VITALS — BP 133/89 | HR 96 | Temp 97.0°F | Ht 62.5 in | Wt 264.0 lb

## 2015-06-22 DIAGNOSIS — J40 Bronchitis, not specified as acute or chronic: Secondary | ICD-10-CM | POA: Diagnosis not present

## 2015-06-22 DIAGNOSIS — J3089 Other allergic rhinitis: Secondary | ICD-10-CM

## 2015-06-22 DIAGNOSIS — Z0000 Encounter for general adult medical examination without abnormal findings: Secondary | ICD-10-CM

## 2015-06-22 DIAGNOSIS — F329 Major depressive disorder, single episode, unspecified: Secondary | ICD-10-CM

## 2015-06-22 DIAGNOSIS — G4700 Insomnia, unspecified: Secondary | ICD-10-CM

## 2015-06-22 DIAGNOSIS — G4733 Obstructive sleep apnea (adult) (pediatric): Secondary | ICD-10-CM

## 2015-06-22 DIAGNOSIS — F32A Depression, unspecified: Secondary | ICD-10-CM

## 2015-06-22 DIAGNOSIS — J309 Allergic rhinitis, unspecified: Secondary | ICD-10-CM

## 2015-06-22 DIAGNOSIS — Z01419 Encounter for gynecological examination (general) (routine) without abnormal findings: Secondary | ICD-10-CM | POA: Diagnosis not present

## 2015-06-22 MED ORDER — ESCITALOPRAM OXALATE 10 MG PO TABS: ORAL_TABLET | ORAL | Status: DC

## 2015-06-22 MED ORDER — BUPROPION HCL ER (XL) 150 MG PO TB24: 150.0000 mg | ORAL_TABLET | Freq: Every day | ORAL | Status: DC

## 2015-06-22 NOTE — Patient Instructions (Signed)
Health Maintenance, Female Adopting a healthy lifestyle and getting preventive care can go a long way to promote health and wellness. Talk with your health care provider about what schedule of regular examinations is right for you. This is a good chance for you to check in with your provider about disease prevention and staying healthy. In between checkups, there are plenty of things you can do on your own. Experts have done a lot of research about which lifestyle changes and preventive measures are most likely to keep you healthy. Ask your health care provider for more information. WEIGHT AND DIET  Eat a healthy diet  Be sure to include plenty of vegetables, fruits, low-fat dairy products, and lean protein.  Do not eat a lot of foods high in solid fats, added sugars, or salt.  Get regular exercise. This is one of the most important things you can do for your health.  Most adults should exercise for at least 150 minutes each week. The exercise should increase your heart rate and make you sweat (moderate-intensity exercise).  Most adults should also do strengthening exercises at least twice a week. This is in addition to the moderate-intensity exercise.  Maintain a healthy weight  Body mass index (BMI) is a measurement that can be used to identify possible weight problems. It estimates body fat based on height and weight. Your health care provider can help determine your BMI and help you achieve or maintain a healthy weight.  For females 20 years of age and older:   A BMI below 18.5 is considered underweight.  A BMI of 18.5 to 24.9 is normal.  A BMI of 25 to 29.9 is considered overweight.  A BMI of 30 and above is considered obese.  Watch levels of cholesterol and blood lipids  You should start having your blood tested for lipids and cholesterol at 47 years of age, then have this test every 5 years.  You may need to have your cholesterol levels checked more often if:  Your lipid  or cholesterol levels are high.  You are older than 47 years of age.  You are at high risk for heart disease.  CANCER SCREENING   Lung Cancer  Lung cancer screening is recommended for adults 55-80 years old who are at high risk for lung cancer because of a history of smoking.  A yearly low-dose CT scan of the lungs is recommended for people who:  Currently smoke.  Have quit within the past 15 years.  Have at least a 30-pack-year history of smoking. A pack year is smoking an average of one pack of cigarettes a day for 1 year.  Yearly screening should continue until it has been 15 years since you quit.  Yearly screening should stop if you develop a health problem that would prevent you from having lung cancer treatment.  Breast Cancer  Practice breast self-awareness. This means understanding how your breasts normally appear and feel.  It also means doing regular breast self-exams. Let your health care provider know about any changes, no matter how small.  If you are in your 20s or 30s, you should have a clinical breast exam (CBE) by a health care provider every 1-3 years as part of a regular health exam.  If you are 40 or older, have a CBE every year. Also consider having a breast X-ray (mammogram) every year.  If you have a family history of breast cancer, talk to your health care provider about genetic screening.  If you   are at high risk for breast cancer, talk to your health care provider about having an MRI and a mammogram every year.  Breast cancer gene (BRCA) assessment is recommended for women who have family members with BRCA-related cancers. BRCA-related cancers include:  Breast.  Ovarian.  Tubal.  Peritoneal cancers.  Results of the assessment will determine the need for genetic counseling and BRCA1 and BRCA2 testing. Cervical Cancer Your health care provider may recommend that you be screened regularly for cancer of the pelvic organs (ovaries, uterus, and  vagina). This screening involves a pelvic examination, including checking for microscopic changes to the surface of your cervix (Pap test). You may be encouraged to have this screening done every 3 years, beginning at age 21.  For women ages 30-65, health care providers may recommend pelvic exams and Pap testing every 3 years, or they may recommend the Pap and pelvic exam, combined with testing for human papilloma virus (HPV), every 5 years. Some types of HPV increase your risk of cervical cancer. Testing for HPV may also be done on women of any age with unclear Pap test results.  Other health care providers may not recommend any screening for nonpregnant women who are considered low risk for pelvic cancer and who do not have symptoms. Ask your health care provider if a screening pelvic exam is right for you.  If you have had past treatment for cervical cancer or a condition that could lead to cancer, you need Pap tests and screening for cancer for at least 20 years after your treatment. If Pap tests have been discontinued, your risk factors (such as having a new sexual partner) need to be reassessed to determine if screening should resume. Some women have medical problems that increase the chance of getting cervical cancer. In these cases, your health care provider may recommend more frequent screening and Pap tests. Colorectal Cancer  This type of cancer can be detected and often prevented.  Routine colorectal cancer screening usually begins at 47 years of age and continues through 47 years of age.  Your health care provider may recommend screening at an earlier age if you have risk factors for colon cancer.  Your health care provider may also recommend using home test kits to check for hidden blood in the stool.  A small camera at the end of a tube can be used to examine your colon directly (sigmoidoscopy or colonoscopy). This is done to check for the earliest forms of colorectal  cancer.  Routine screening usually begins at age 50.  Direct examination of the colon should be repeated every 5-10 years through 47 years of age. However, you may need to be screened more often if early forms of precancerous polyps or small growths are found. Skin Cancer  Check your skin from head to toe regularly.  Tell your health care provider about any new moles or changes in moles, especially if there is a change in a mole's shape or color.  Also tell your health care provider if you have a mole that is larger than the size of a pencil eraser.  Always use sunscreen. Apply sunscreen liberally and repeatedly throughout the day.  Protect yourself by wearing long sleeves, pants, a wide-brimmed hat, and sunglasses whenever you are outside. HEART DISEASE, DIABETES, AND HIGH BLOOD PRESSURE   High blood pressure causes heart disease and increases the risk of stroke. High blood pressure is more likely to develop in:  People who have blood pressure in the high end   of the normal range (130-139/85-89 mm Hg).  People who are overweight or obese.  People who are African American.  If you are 38-23 years of age, have your blood pressure checked every 3-5 years. If you are 61 years of age or older, have your blood pressure checked every year. You should have your blood pressure measured twice--once when you are at a hospital or clinic, and once when you are not at a hospital or clinic. Record the average of the two measurements. To check your blood pressure when you are not at a hospital or clinic, you can use:  An automated blood pressure machine at a pharmacy.  A home blood pressure monitor.  If you are between 45 years and 39 years old, ask your health care provider if you should take aspirin to prevent strokes.  Have regular diabetes screenings. This involves taking a blood sample to check your fasting blood sugar level.  If you are at a normal weight and have a low risk for diabetes,  have this test once every three years after 47 years of age.  If you are overweight and have a high risk for diabetes, consider being tested at a younger age or more often. PREVENTING INFECTION  Hepatitis B  If you have a higher risk for hepatitis B, you should be screened for this virus. You are considered at high risk for hepatitis B if:  You were born in a country where hepatitis B is common. Ask your health care provider which countries are considered high risk.  Your parents were born in a high-risk country, and you have not been immunized against hepatitis B (hepatitis B vaccine).  You have HIV or AIDS.  You use needles to inject street drugs.  You live with someone who has hepatitis B.  You have had sex with someone who has hepatitis B.  You get hemodialysis treatment.  You take certain medicines for conditions, including cancer, organ transplantation, and autoimmune conditions. Hepatitis C  Blood testing is recommended for:  Everyone born from [birth year] through 1967.  Anyone with known risk factors for hepatitis C. Sexually transmitted infections (STIs)  You should be screened for sexually transmitted infections (STIs) including gonorrhea and chlamydia if:  You are sexually active and are younger than 47 years of age.  You are older than 47 years of age and your health care provider tells you that you are at risk for this type of infection.  Your sexual activity has changed since you were last screened and you are at an increased risk for chlamydia or gonorrhea. Ask your health care provider if you are at risk.  If you do not have HIV, but are at risk, it may be recommended that you take a prescription medicine daily to prevent HIV infection. This is called pre-exposure prophylaxis (PrEP). You are considered at risk if:  You are sexually active and do not regularly use condoms or know the HIV status of your partner(s).  You take drugs by injection.  You are sexually  active with a partner who has HIV. Talk with your health care provider about whether you are at high risk of being infected with HIV. If you choose to begin PrEP, you should first be tested for HIV. You should then be tested every 3 months for as long as you are taking PrEP.  PREGNANCY   If you are premenopausal and you may become pregnant, ask your health care provider about preconception counseling.  If you may  become pregnant, take 400 to 800 micrograms (mcg) of folic acid every day.  If you want to prevent pregnancy, talk to your health care provider about birth control (contraception). OSTEOPOROSIS AND MENOPAUSE   Osteoporosis is a disease in which the bones lose minerals and strength with aging. This can result in serious bone fractures. Your risk for osteoporosis can be identified using a bone density scan.  If you are 65 years of age or older, or if you are at risk for osteoporosis and fractures, ask your health care provider if you should be screened.  Ask your health care provider whether you should take a calcium or vitamin D supplement to lower your risk for osteoporosis.  Menopause may have certain physical symptoms and risks.  Hormone replacement therapy may reduce some of these symptoms and risks. Talk to your health care provider about whether hormone replacement therapy is right for you.  HOME CARE INSTRUCTIONS   Schedule regular health, dental, and eye exams.  Stay current with your immunizations.   Do not use any tobacco products including cigarettes, chewing tobacco, or electronic cigarettes.  If you are pregnant, do not drink alcohol.  If you are breastfeeding, limit how much and how often you drink alcohol.  Limit alcohol intake to no more than 1 drink per day for nonpregnant women. One drink equals 12 ounces of beer, 5 ounces of wine, or 1 ounces of hard liquor.  Do not use street drugs.  Do not share needles.  Ask your health care provider for help if  you need support or information about quitting drugs.  Tell your health care provider if you often feel depressed.  Tell your health care provider if you have ever been abused or do not feel safe at home.   This information is not intended to replace advice given to you by your health care provider. Make sure you discuss any questions you have with your health care provider.   Document Released: 11/26/2010 Document Revised: 06/03/2014 Document Reviewed: 04/14/2013 Elsevier Interactive Patient Education 2016 [employer]  

## 2015-06-22 NOTE — Addendum Note (Signed)
Addended by: Prescott Gum on: 06/22/2015 04:57 PM   Modules accepted: Kipp Brood

## 2015-06-22 NOTE — Progress Notes (Signed)
Subjective:    Patient ID: Cathy [name], female    DOB: 03-03-1971, 45 y.o.   MRN: [id number]  HPI   Patient is here today for annual physical exam and PAP- she is doing well today without complaints.  Outpatient Encounter Prescriptions as of 06/22/2015  Medication Sig  . buPROPion (WELLBUTRIN XL) 150 MG 24 hr tablet TAKE 1 TABLET BY MOUTH EVERY DAY  . escitalopram (LEXAPRO) 10 MG tablet TAKE 1 TABLET (10 MG TOTAL) BY MOUTH DAILY.  . fexofenadine-pseudoephedrine (ALLEGRA-D) 60-120 MG per tablet Take 1 tablet by mouth daily.  . mometasone-formoterol (DULERA) 100-5 MCG/ACT AERO Inhale 2 puffs into the lungs daily.  . Pediatric Multivit-Minerals-C (CHILDRENS MULTIVITAMIN PO) Take 1 capsule by mouth daily. Reported on 06/22/2015  . temazepam (RESTORIL) 15 MG capsule 1 or 2 at bedtime for sleep as needed  . [DISCONTINUED] Hydrocodone-Guaifenesin (FLOWTUSS) 2.5-200 MG/5ML SOLN Take 10 mLs by mouth every 6 (six) hours as needed.  . [DISCONTINUED] meloxicam (MOBIC) 15 MG tablet Take 1 tablet (15 mg total) by mouth daily. (Patient not taking: Reported on 08/10/2014)   No facility-administered encounter medications on file as of 06/22/2015.   Patient Active Problem List   Diagnosis Date Noted  . Insomnia 04/08/2013  . Bronchitis 02/10/2012  . Perennial allergic rhinitis 08/04/2011  . OSA (obstructive sleep apnea) 08/01/2011  . DEPRESSION 06/29/2008  . BACK PAIN, LUMBAR 06/29/2008      Review of Systems  Constitutional: Negative for fever, chills, activity change and fatigue.  Respiratory: Negative for shortness of breath.   Cardiovascular: Negative for chest pain and palpitations.  Gastrointestinal: Negative for diarrhea, constipation and blood in stool.  Endocrine: Negative for cold intolerance and heat intolerance.  Genitourinary: Negative for dysuria and frequency.  Skin: Negative for rash.  Neurological: Negative for headaches.  All other systems reviewed and are negative.     Objective:   Physical Exam  Constitutional: She is oriented to person, place, and time. She appears well-developed and well-nourished.  HENT:  Head: Normocephalic.  Right Ear: Hearing, tympanic membrane, external ear and ear canal normal.  Left Ear: Hearing, tympanic membrane, external ear and ear canal normal.  Nose: Nose normal.  Mouth/Throat: Uvula is midline and oropharynx is clear and moist.  Eyes: Conjunctivae and EOM are normal. Pupils are equal, round, and reactive to light.  Neck: Normal range of motion and full passive range of motion without pain. Neck supple. No JVD present. Carotid bruit is not present. No thyroid mass and no thyromegaly present.  Cardiovascular: Normal rate, normal heart sounds and intact distal pulses.   No murmur heard. Pulmonary/Chest: Effort normal and breath sounds normal. Right breast exhibits no inverted nipple, no mass, no nipple discharge, no skin change and no tenderness. Left breast exhibits no inverted nipple, no mass, no nipple discharge, no skin change and no tenderness.  Abdominal: Soft. Bowel sounds are normal. She exhibits no mass. There is no tenderness.  Genitourinary: Vagina normal and uterus normal. No breast swelling, tenderness, discharge or bleeding.  bimanual exam-No adnexal masses or tenderness. Cervix parous and pink- no discharge  Musculoskeletal: Normal range of motion.  Lymphadenopathy:    She has no cervical adenopathy.  Neurological: She is alert and oriented to person, place, and time.  Skin: Skin is warm and dry.  Psychiatric: She has a normal mood and affect. Her behavior is normal. Judgment and thought content normal.   BP 133/89 mmHg  Pulse 96  Temp(Src) 97 F (36.1 C) (Oral)  Ht 5' 2.5" (1.588 m)  Wt 264 lb (119.75 kg)  BMI 47.49 kg/m2       Assessment & Plan:   1. Annual physical exam - CMP14+EGFR - Lipid panel - CBC with Differential/Platelet - Thyroid Panel With TSH - VITAMIN D 25 Hydroxy (Vit-D  Deficiency, Fractures)  2. Encounter for routine gynecological examination - Pap IG w/ reflex to HPV when ASC-U  3. OSA (obstructive sleep apnea) Continue to use CPAP   4. Perennial allergic rhinitis  5. Bronchitis  6. Insomnia Bedtime ritual  7. Depression Stress management - escitalopram (LEXAPRO) 10 MG tablet; TAKE 1 TABLET (10 MG TOTAL) BY MOUTH DAILY.  Dispense: 90 tablet; Refill: 1 - buPROPion (WELLBUTRIN XL) 150 MG 24 hr tablet; Take 1 tablet (150 mg total) by mouth daily.  Dispense: 90 tablet; Refill: 1    Labs pending Health maintenance reviewed Diet and exercise encouraged Continue all meds Follow up  In 1 year   [place], FNP

## 2015-06-23 LAB — CBC WITH DIFFERENTIAL/PLATELET
Basophils Absolute: 0 10*3/uL (ref 0.0–0.2)
Basos: 0 %
EOS (ABSOLUTE): 0.6 10*3/uL — ABNORMAL HIGH (ref 0.0–0.4)
EOS: 5 %
HEMATOCRIT: 35.5 % (ref 34.0–46.6)
HEMOGLOBIN: 11.9 g/dL (ref 11.1–15.9)
Immature Grans (Abs): 0 10*3/uL (ref 0.0–0.1)
Immature Granulocytes: 0 %
Lymphocytes Absolute: 3.7 10*3/uL — ABNORMAL HIGH (ref 0.7–3.1)
Lymphs: 31 %
MCH: 28.7 pg (ref 26.6–33.0)
MCHC: 33.5 g/dL (ref 31.5–35.7)
MCV: 86 fL (ref 79–97)
MONOCYTES: 5 %
Monocytes Absolute: 0.6 10*3/uL (ref 0.1–0.9)
Neutrophils Absolute: 7 10*3/uL (ref 1.4–7.0)
Neutrophils: 59 %
Platelets: 329 10*3/uL (ref 150–379)
RBC: 4.15 x10E6/uL (ref 3.77–5.28)
RDW: 14.6 % (ref 12.3–15.4)
WBC: 12 10*3/uL — ABNORMAL HIGH (ref 3.4–10.8)

## 2015-06-23 LAB — LIPID PANEL
Chol/HDL Ratio: 4.4 ratio units (ref 0.0–4.4)
Cholesterol, Total: 170 mg/dL (ref 100–199)
HDL: 39 mg/dL — ABNORMAL LOW (ref >39)
LDL Calculated: 109 mg/dL — ABNORMAL HIGH (ref 0–99)
TRIGLYCERIDES: 110 mg/dL (ref 0–149)
VLDL Cholesterol Cal: 22 mg/dL (ref 5–40)

## 2015-06-23 LAB — CMP14+EGFR
A/G RATIO: 1.1 (ref 1.1–2.5)
ALT: 20 IU/L (ref 0–32)
AST: 26 IU/L (ref 0–40)
Albumin: 3.9 g/dL (ref 3.5–5.5)
Alkaline Phosphatase: 111 IU/L (ref 39–117)
BUN/Creatinine Ratio: 17 (ref 9–23)
BUN: 12 mg/dL (ref 6–24)
Bilirubin Total: 0.4 mg/dL (ref 0.0–1.2)
CALCIUM: 8.9 mg/dL (ref 8.7–10.2)
CO2: 21 mmol/L (ref 18–29)
Chloride: 102 mmol/L (ref 96–106)
Creatinine, Ser: 0.72 mg/dL (ref 0.57–1.00)
GFR calc Af Amer: 116 mL/min/{1.73_m2} (ref >59)
GFR, EST NON AFRICAN AMERICAN: 101 mL/min/{1.73_m2} (ref >59)
GLUCOSE: 94 mg/dL (ref 65–99)
Globulin, Total: 3.4 g/dL (ref 1.5–4.5)
POTASSIUM: 4.4 mmol/L (ref 3.5–5.2)
Sodium: 141 mmol/L (ref 134–144)
Total Protein: 7.3 g/dL (ref 6.0–8.5)

## 2015-06-23 LAB — THYROID PANEL WITH TSH
Free Thyroxine Index: 2.2 (ref 1.2–4.9)
T3 UPTAKE RATIO: 28 % (ref 24–39)
T4, Total: 7.9 ug/dL (ref 4.5–12.0)
TSH: 3.08 u[IU]/mL (ref 0.450–4.500)

## 2015-06-23 LAB — VITAMIN D 25 HYDROXY (VIT D DEFICIENCY, FRACTURES): Vit D, 25-Hydroxy: 11.8 ng/mL — ABNORMAL LOW (ref 30.0–100.0)

## 2015-06-26 LAB — PAP IG W/ RFLX HPV ASCU: PAP Smear Comment: 0

## 2015-09-02 ENCOUNTER — Other Ambulatory Visit: Payer: Self-pay | Admitting: Family

## 2015-09-21 ENCOUNTER — Other Ambulatory Visit: Payer: Self-pay | Admitting: Family

## 2015-12-08 ENCOUNTER — Other Ambulatory Visit: Payer: Self-pay | Admitting: Nurse Practitioner

## 2015-12-08 NOTE — Telephone Encounter (Signed)
Last seen 06/22/15  MMM

## 2015-12-16 ENCOUNTER — Other Ambulatory Visit: Payer: Self-pay | Admitting: Nurse Practitioner

## 2016-01-12 ENCOUNTER — Ambulatory Visit (INDEPENDENT_AMBULATORY_CARE_PROVIDER_SITE_OTHER): Payer: 59 | Admitting: Family Medicine

## 2016-01-12 ENCOUNTER — Encounter: Payer: Self-pay | Admitting: Family Medicine

## 2016-01-12 VITALS — BP 130/81 | HR 104 | Temp 98.1°F | Ht 62.5 in | Wt 281.0 lb

## 2016-01-12 DIAGNOSIS — R300 Dysuria: Secondary | ICD-10-CM | POA: Diagnosis not present

## 2016-01-12 DIAGNOSIS — F329 Major depressive disorder, single episode, unspecified: Secondary | ICD-10-CM | POA: Diagnosis not present

## 2016-01-12 DIAGNOSIS — F32A Depression, unspecified: Secondary | ICD-10-CM

## 2016-01-12 LAB — URINALYSIS
BILIRUBIN UA: NEGATIVE
GLUCOSE, UA: NEGATIVE
KETONES UA: NEGATIVE
Nitrite, UA: NEGATIVE
Protein, UA: NEGATIVE
Specific Gravity, UA: 1.025 (ref 1.005–1.030)
UUROB: 0.2 mg/dL (ref 0.2–1.0)
pH, UA: 5 (ref 5.0–7.5)

## 2016-01-12 MED ORDER — ESCITALOPRAM OXALATE 20 MG PO TABS: 20.0000 mg | ORAL_TABLET | Freq: Every day | ORAL | 2 refills | Status: DC

## 2016-01-12 MED ORDER — SULFAMETHOXAZOLE-TRIMETHOPRIM 800-160 MG PO TABS: 1.0000 | ORAL_TABLET | Freq: Two times a day (BID) | ORAL | 0 refills | Status: AC

## 2016-01-12 NOTE — Progress Notes (Signed)
BP 130/81 (BP Location: Left Arm, Patient Position: Sitting, Cuff Size: Large)   Pulse (!) 104   Temp 98.1 F (36.7 C) (Oral)   Ht 5' 2.5" (1.588 m)   Wt 281 lb (127.5 kg)   BMI 50.58 kg/m    Subjective:    Patient ID: Cathy [name], female    DOB: 1971/02/14, 45 y.o.   MRN: [id number]  HPI: Cathy Kocherngela D Kitchens is a 45 y.o. female presenting on 01/12/2016 for Urinary Tract Infection ( urinary odor, cloudiness, frequent urination)   HPI Dysuria and urinary odor Patient comes in today because she has been having 2-3 weeks of dysuria and urinary odor. This is been something that she is tried to treat with increased hydration and cranberry pills and cranberry juice but she feels like she just can't get rid of it. She denies any fevers or chills or flank tenderness but she just has this continuous burning and irritation and odor and darkened urine. She denies any hematuria.  Anxiety and depression Patient is coming in today also with complaints of increased anxiety. Her mother was just diagnosed with liver cirrhosis and is having a lot of health issues along with a child who is going in for surgery in the near future and some other familial issues in house issues that she is dealing with and just increased her anxiety to the point that she needs something more than what she is currently on. She is currently on Wellbutrin and Lexapro but Lexapro 10 mg. She would like to try a higher dose of Lexapro temporarily to see if they can help and then see if in the future she still needs that. She denies any suicidal ideations.  Relevant past medical, surgical, family and social history reviewed and updated as indicated. Interim medical history since our last visit reviewed. Allergies and medications reviewed and updated.  Review of Systems  Constitutional: Negative for chills and fever.  HENT: Negative for congestion, ear discharge and ear pain.   Eyes: Negative for redness and visual disturbance.    Respiratory: Negative for chest tightness and shortness of breath.   Cardiovascular: Negative for chest pain and leg swelling.  Gastrointestinal: Negative for abdominal pain.  Genitourinary: Positive for dysuria, frequency and urgency. Negative for difficulty urinating, flank pain, hematuria, pelvic pain, vaginal bleeding, vaginal discharge and vaginal pain.  Musculoskeletal: Negative for back pain and gait problem.  Skin: Negative for rash.  Neurological: Negative for light-headedness and headaches.  Psychiatric/Behavioral: Positive for dysphoric mood. Negative for agitation, behavioral problems, decreased concentration, self-injury, sleep disturbance and suicidal ideas. The patient is nervous/anxious.   All other systems reviewed and are negative.   Per HPI unless specifically indicated above     Medication List       Accurate as of 01/12/16  5:54 PM. Always use your most recent med list.          buPROPion 150 MG 24 hr tablet Commonly known as:  WELLBUTRIN XL TAKE 1 TABLET BY MOUTH EVERY DAY   CHILDRENS MULTIVITAMIN PO Take 1 capsule by mouth daily. Reported on 06/22/2015   escitalopram 20 MG tablet Commonly known as:  LEXAPRO Take 1 tablet (20 mg total) by mouth daily.   fexofenadine-pseudoephedrine 60-120 MG 12 hr tablet Commonly known as:  ALLEGRA-D Take 1 tablet by mouth daily.   mometasone-formoterol 100-5 MCG/ACT Aero Commonly known as:  DULERA Inhale 2 puffs into the lungs daily.   sulfamethoxazole-trimethoprim 800-160 MG tablet Commonly known as:  BACTRIM  DS,SEPTRA DS Take 1 tablet by mouth 2 (two) times daily.   temazepam 15 MG capsule Commonly known as:  RESTORIL 1 or 2 at bedtime for sleep as needed          Objective:    BP 130/81 (BP Location: Left Arm, Patient Position: Sitting, Cuff Size: Large)   Pulse (!) 104   Temp 98.1 F (36.7 C) (Oral)   Ht 5' 2.5" (1.588 m)   Wt 281 lb (127.5 kg)   BMI 50.58 kg/m   Wt Readings from Last 3  Encounters:  01/12/16 281 lb (127.5 kg)  06/22/15 264 lb (119.7 kg)  08/10/14 262 lb 9.6 oz (119.1 kg)    Physical Exam  Constitutional: She is oriented to person, place, and time. She appears well-developed and well-nourished. No distress.  Eyes: Conjunctivae and EOM are normal.  Neck: Neck supple. No thyromegaly present.  Cardiovascular: Normal rate, regular rhythm, normal heart sounds and intact distal pulses.   No murmur heard. Pulmonary/Chest: Effort normal and breath sounds normal. No respiratory distress. She has no wheezes.  Abdominal: Soft. Bowel sounds are normal. She exhibits no distension. There is no tenderness. There is no rebound and no guarding.  Musculoskeletal: Normal range of motion. She exhibits no edema.  Lymphadenopathy:    She has no cervical adenopathy.  Neurological: She is alert and oriented to person, place, and time. Coordination normal.  Skin: Skin is warm and dry. No rash noted. She is not diaphoretic.  Psychiatric: Her behavior is normal. Judgment and thought content normal. Her mood appears anxious. She expresses no suicidal ideation. She expresses no suicidal plans.  Nursing note and vitals reviewed.  Urinalysis, dip only: 1+ leukocytes and trace blood, otherwise normal    Assessment & Plan:   Problem List Items Addressed This Visit      Other   Depression   Relevant Medications   escitalopram (LEXAPRO) 20 MG tablet    Other Visit Diagnoses    Dysuria    -  Primary   UA shows 1+ leukocytes and trace blood cells, will send culture and treat symptomatically for UTI   Relevant Medications   sulfamethoxazole-trimethoprim (BACTRIM DS,SEPTRA DS) 800-160 MG tablet   Other Relevant Orders   Urinalysis   Urine culture       Follow up plan: Return in about 4 weeks (around 02/09/2016), or if symptoms worsen or fail to improve, for Recheck anxiety depression.  Counseling provided for all of the vaccine components Orders Placed This Encounter    Procedures  . Urine culture  . Urinalysis    Arville CareJoshua Kalanie Fewell, MD [facility] 01/12/2016, 5:54 PM

## 2016-01-12 NOTE — Progress Notes (Signed)
BP 130/81 (BP Location: Left Arm, Patient Position: Sitting, Cuff Size: Large)   Pulse (!) 104   Temp 98.1 F (36.7 C) (Oral)   Ht 5' 2.5" (1.588 m)   Wt 281 lb (127.5 kg)   BMI 50.58 kg/m    Subjective:    Patient ID: Sherry Matthews, female    DOB: 1971/02/14, 45 y.o.   MRN: [id number]  HPI: Sherry Kocherngela D Kitchens is a 45 y.o. female presenting on 01/12/2016 for Urinary Tract Infection ( urinary odor, cloudiness, frequent urination)   HPI Dysuria and urinary odor Patient comes in today because she has been having 2-3 weeks of dysuria and urinary odor. This is been something that she is tried to treat with increased hydration and cranberry pills and cranberry juice but she feels like she just can't get rid of it. She denies any fevers or chills or flank tenderness but she just has this continuous burning and irritation and odor and darkened urine. She denies any hematuria.  Anxiety and depression Patient is coming in today also with complaints of increased anxiety. Her mother was just diagnosed with liver cirrhosis and is having a lot of health issues along with a child who is going in for surgery in the near future and some other familial issues in house issues that she is dealing with and just increased her anxiety to the point that she needs something more than what she is currently on. She is currently on Wellbutrin and Lexapro but Lexapro 10 mg. She would like to try a higher dose of Lexapro temporarily to see if they can help and then see if in the future she still needs that. She denies any suicidal ideations.  Relevant past medical, surgical, family and social history reviewed and updated as indicated. Interim medical history since our last visit reviewed. Allergies and medications reviewed and updated.  Review of Systems  Constitutional: Negative for chills and fever.  HENT: Negative for congestion, ear discharge and ear pain.   Eyes: Negative for redness and visual disturbance.    Respiratory: Negative for chest tightness and shortness of breath.   Cardiovascular: Negative for chest pain and leg swelling.  Gastrointestinal: Negative for abdominal pain.  Genitourinary: Positive for dysuria, frequency and urgency. Negative for difficulty urinating, flank pain, hematuria, pelvic pain, vaginal bleeding, vaginal discharge and vaginal pain.  Musculoskeletal: Negative for back pain and gait problem.  Skin: Negative for rash.  Neurological: Negative for light-headedness and headaches.  Psychiatric/Behavioral: Positive for dysphoric mood. Negative for agitation, behavioral problems, decreased concentration, self-injury, sleep disturbance and suicidal ideas. The patient is nervous/anxious.   All other systems reviewed and are negative.   Per HPI unless specifically indicated above     Medication List       Accurate as of 01/12/16  5:54 PM. Always use your most recent med list.          buPROPion 150 MG 24 hr tablet Commonly known as:  WELLBUTRIN XL TAKE 1 TABLET BY MOUTH EVERY DAY   CHILDRENS MULTIVITAMIN PO Take 1 capsule by mouth daily. Reported on 06/22/2015   escitalopram 20 MG tablet Commonly known as:  LEXAPRO Take 1 tablet (20 mg total) by mouth daily.   fexofenadine-pseudoephedrine 60-120 MG 12 hr tablet Commonly known as:  ALLEGRA-D Take 1 tablet by mouth daily.   mometasone-formoterol 100-5 MCG/ACT Aero Commonly known as:  DULERA Inhale 2 puffs into the lungs daily.   sulfamethoxazole-trimethoprim 800-160 MG tablet Commonly known as:  BACTRIM  DS,SEPTRA DS Take 1 tablet by mouth 2 (two) times daily.   temazepam 15 MG capsule Commonly known as:  RESTORIL 1 or 2 at bedtime for sleep as needed          Objective:    BP 130/81 (BP Location: Left Arm, Patient Position: Sitting, Cuff Size: Large)   Pulse (!) 104   Temp 98.1 F (36.7 C) (Oral)   Ht 5' 2.5" (1.588 m)   Wt 281 lb (127.5 kg)   BMI 50.58 kg/m   Wt Readings from Last 3  Encounters:  01/12/16 281 lb (127.5 kg)  06/22/15 264 lb (119.7 kg)  08/10/14 262 lb 9.6 oz (119.1 kg)    Physical Exam  Constitutional: She is oriented to person, place, and time. She appears well-developed and well-nourished. No distress.  Eyes: Conjunctivae and EOM are normal.  Neck: Neck supple. No thyromegaly present.  Cardiovascular: Normal rate, regular rhythm, normal heart sounds and intact distal pulses.   No murmur heard. Pulmonary/Chest: Effort normal and breath sounds normal. No respiratory distress. She has no wheezes.  Abdominal: Soft. Bowel sounds are normal. She exhibits no distension. There is no tenderness. There is no rebound and no guarding.  Musculoskeletal: Normal range of motion. She exhibits no edema.  Lymphadenopathy:    She has no cervical adenopathy.  Neurological: She is alert and oriented to person, place, and time. Coordination normal.  Skin: Skin is warm and dry. No rash noted. She is not diaphoretic.  Psychiatric: Her behavior is normal. Judgment and thought content normal. Her mood appears anxious. She expresses no suicidal ideation. She expresses no suicidal plans.  Nursing note and vitals reviewed.  Urinalysis, dip only: 1+ leukocytes and trace blood, otherwise normal    Assessment & Plan:   Problem List Items Addressed This Visit      Other   Depression   Relevant Medications   escitalopram (LEXAPRO) 20 MG tablet    Other Visit Diagnoses    Dysuria    -  Primary   UA shows 1+ leukocytes and trace blood cells, will send culture and treat symptomatically for UTI   Relevant Medications   sulfamethoxazole-trimethoprim (BACTRIM DS,SEPTRA DS) 800-160 MG tablet   Other Relevant Orders   Urinalysis   Urine culture       Follow up plan: Return in about 4 weeks (around 02/09/2016), or if symptoms worsen or fail to improve, for Recheck anxiety depression.  Counseling provided for all of the vaccine components Orders Placed This Encounter    Procedures  . Urine culture  . Urinalysis    Arville CareJoshua Dettinger, MD [facility] 01/12/2016, 5:54 PM

## 2016-01-14 LAB — URINE CULTURE

## 2016-01-24 ENCOUNTER — Telehealth: Payer: Self-pay | Admitting: Nurse Practitioner

## 2016-02-29 ENCOUNTER — Other Ambulatory Visit: Payer: Self-pay

## 2016-02-29 DIAGNOSIS — F339 Major depressive disorder, recurrent, unspecified: Secondary | ICD-10-CM

## 2016-02-29 MED ORDER — ESCITALOPRAM OXALATE 20 MG PO TABS: 20.0000 mg | ORAL_TABLET | Freq: Every day | ORAL | 0 refills | Status: DC

## 2016-03-17 ENCOUNTER — Other Ambulatory Visit: Payer: Self-pay | Admitting: Nurse Practitioner

## 2016-03-18 ENCOUNTER — Telehealth: Payer: Self-pay | Admitting: Nurse Practitioner

## 2016-03-18 DIAGNOSIS — F339 Major depressive disorder, recurrent, unspecified: Secondary | ICD-10-CM

## 2016-03-18 MED ORDER — ESCITALOPRAM OXALATE 20 MG PO TABS: 20.0000 mg | ORAL_TABLET | Freq: Every day | ORAL | 0 refills | Status: AC

## 2016-03-18 NOTE — Telephone Encounter (Signed)
done

## 2016-07-04 IMAGING — CR DG FOOT COMPLETE 3+V*R*
3 series · 3 of 3 positions shown · non-contrast
Comparison: None.

CLINICAL DATA: Pain and swelling involving the toe of the right
foot. No known injury.

EXAM:
RIGHT FOOT COMPLETE - 3+ VIEW

[view not recorded (1 of 3)]
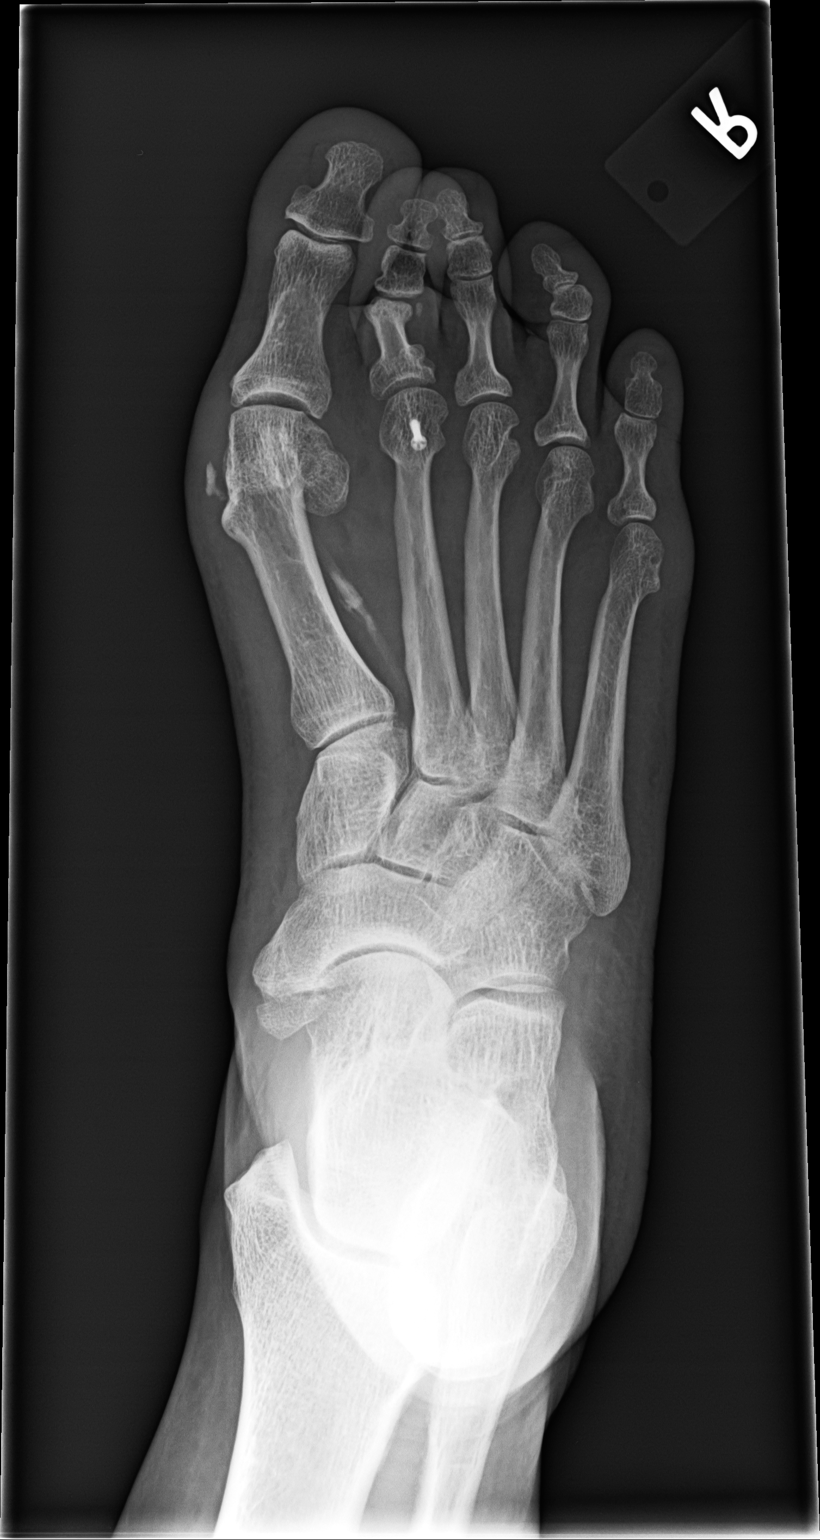

[view not recorded (2 of 3)]
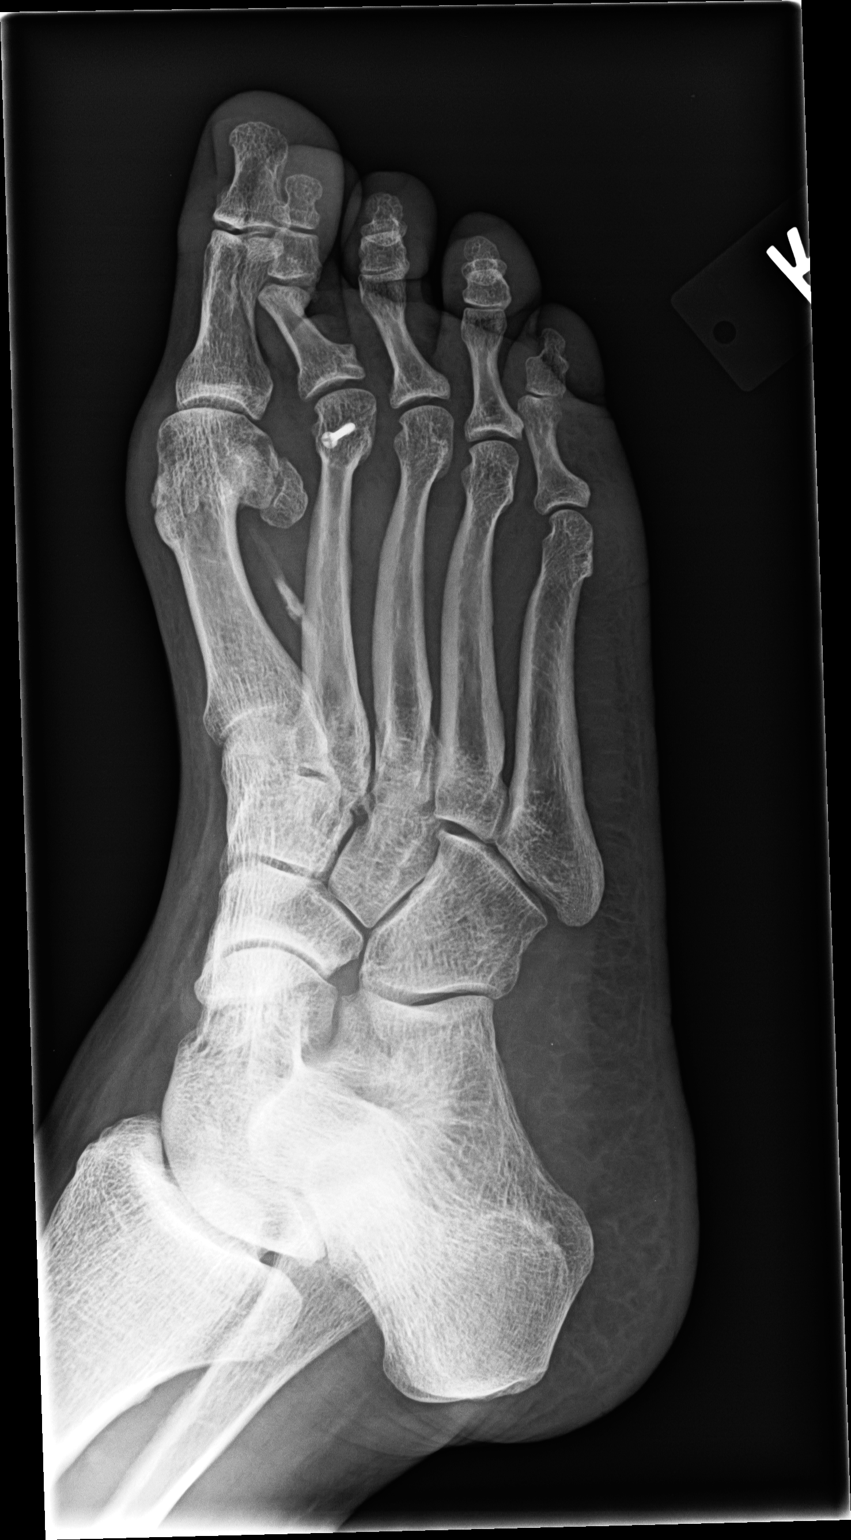

[view not recorded (3 of 3)]
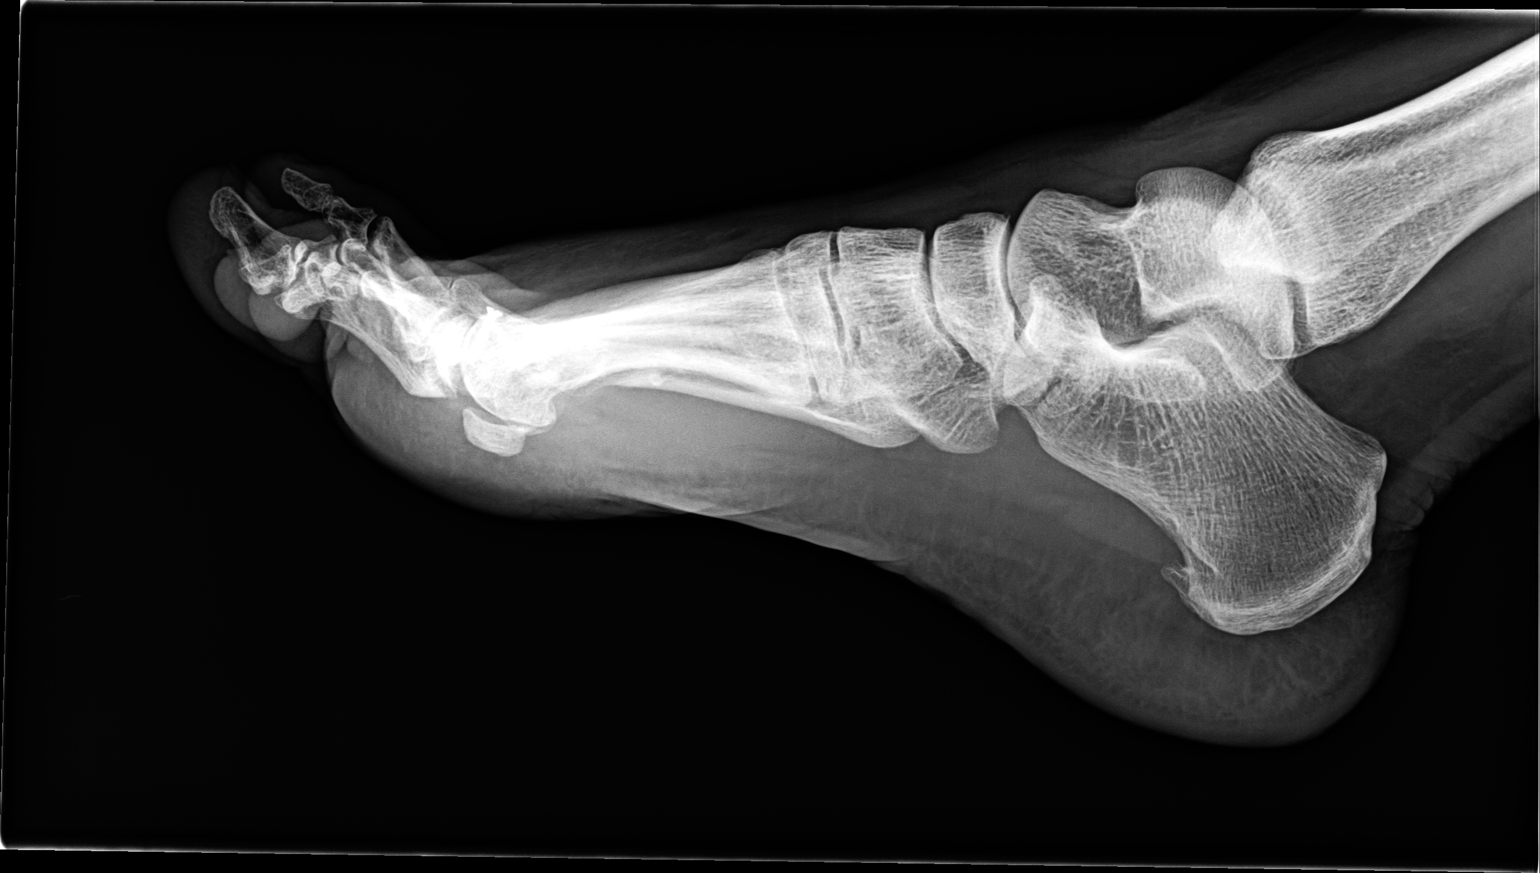

[3 of 3 positions shown; findings below may reference images not displayed]

FINDINGS: There is either posttraumatic or iatrogenic deformity involving the
first metatarsal. A screw is located within the 2nd metatarsal head.
There is presumed posttraumatic deformity involving the distal end
of the proximal phalanx of the second digit. Mild hallux valgus
deformity with associated mild degenerative change with joint space
loss. Joint spaces are otherwise grossly preserved. No definite
erosions. Incidental note is made of an os tibialis externum. Small
plantar calcaneal spur. Regional soft tissues appear normal.
IMPRESSION: 1. No acute findings.
2. Posttraumatic/iatrogenic change of the first metatarsal, second
metatarsal head and proximal phalanx of the second digit.
3. Mild degenerative change of the first MTP joint.

## 2017-01-29 ENCOUNTER — Ambulatory Visit (HOSPITAL_COMMUNITY)
Admission: RE | Admit: 2017-01-29 | Discharge: 2017-01-29 | Disposition: A | Discharge status: Home / Self Care | Payer: Self-pay | Source: Ambulatory Visit | Admitting: Radiology

## 2020-02-23 ENCOUNTER — Ambulatory Visit (HOSPITAL_COMMUNITY)
Admission: RE | Admit: 2020-02-23 | Discharge: 2020-02-23 | Disposition: A | Discharge status: Home / Self Care | Payer: Self-pay | Source: Ambulatory Visit | Admitting: Radiology

## 2020-11-30 ENCOUNTER — Ambulatory Visit: Payer: Managed Care, Other (non HMO) | Attending: Registered Nurse | Admitting: Registered Nurse

## 2020-11-30 ENCOUNTER — Other Ambulatory Visit: Payer: Self-pay

## 2020-11-30 ENCOUNTER — Encounter (HOSPITAL_BASED_OUTPATIENT_CLINIC_OR_DEPARTMENT_OTHER): Payer: Self-pay | Admitting: Registered Nurse

## 2020-11-30 VITALS — BP 122/84 | HR 94 | Temp 97.6°F | Ht 62.0 in | Wt 257.5 lb

## 2020-11-30 DIAGNOSIS — J309 Allergic rhinitis, unspecified: Secondary | ICD-10-CM | POA: Insufficient documentation

## 2020-11-30 DIAGNOSIS — Z6841 Body Mass Index (BMI) 40.0 and over, adult: Secondary | ICD-10-CM | POA: Insufficient documentation

## 2020-11-30 DIAGNOSIS — J302 Other seasonal allergic rhinitis: Secondary | ICD-10-CM | POA: Insufficient documentation

## 2020-11-30 DIAGNOSIS — K219 Gastro-esophageal reflux disease without esophagitis: Secondary | ICD-10-CM | POA: Insufficient documentation

## 2020-11-30 DIAGNOSIS — Z1239 Encounter for other screening for malignant neoplasm of breast: Secondary | ICD-10-CM

## 2020-11-30 DIAGNOSIS — L120 Bullous pemphigoid: Secondary | ICD-10-CM | POA: Insufficient documentation

## 2020-11-30 DIAGNOSIS — Z79899 Other long term (current) drug therapy: Secondary | ICD-10-CM | POA: Insufficient documentation

## 2020-11-30 DIAGNOSIS — F39 Unspecified mood [affective] disorder: Secondary | ICD-10-CM | POA: Insufficient documentation

## 2020-11-30 DIAGNOSIS — M5416 Radiculopathy, lumbar region: Secondary | ICD-10-CM | POA: Insufficient documentation

## 2020-11-30 DIAGNOSIS — G4733 Obstructive sleep apnea (adult) (pediatric): Secondary | ICD-10-CM

## 2020-11-30 DIAGNOSIS — Z9989 Dependence on other enabling machines and devices: Secondary | ICD-10-CM | POA: Insufficient documentation

## 2020-11-30 HISTORY — DX: Obstructive sleep apnea (adult) (pediatric): G47.33

## 2020-11-30 HISTORY — DX: Radiculopathy, lumbar region: M54.16

## 2020-11-30 NOTE — Progress Notes (Signed)
[facility]: Family Medicine Office Visit      Sherry Matthews  MRN: [id number] DOB: [birth year]  Date of Service: 11/30/2020    Sherry Matthews is a 50 y.o. female who presents to [facility] today with chief complaint of Establish Care    HPI   New Patient here to establish care.   Previous PCP [place], last seen in May.   Presents with the following past medical history:    1. Mood disorder. Currently on Celexa for past year. Was on Lexapro prior for 20+ years. Switched due to weight gain. Mood doing well. Denies s/e of medication.     2. 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 Currently taking prednisone an Dulera. Following with Dermatology in [place], doing televisits. Has appt with Derm at [facility] scheduled to establish.     3. Lumbar DDD with left leg radiculopathy. MRI 2018 completed, L5-S1 disc protrusion producing asymmetric mass effect upon the traversing right S1 nerve root. There is contacting of both exiting L5 nerve roots laterally. She did see Ortho specialist, no surgery completed. PT tried but did not follow through. Taking gabapentin for symptoms control, takes mostly at night as daytime makes her very tired.     4. OSA wears CPAP nightly.     5. Contrave for weight loss. On for 3 years. Only medication she has tried. Lost 10-15 lbs first year on medication then stopped. Not lost any additional weight. Does help for cravings. Afternoon dose usually doesn't take due to s/e of insomnia. On hydrochlorothiazide to help with elevated BP caused by Contrave medication. Would be interested in alternative medications.     No red flag symptoms. See ROS for additional details.  ASSESSMENT/PLAN       ICD-10-CM    1. Encounter for screening for malignant neoplasm of breast, unspecified screening modality  Z12.39 MAMMO BILATERAL SCREENING-ADDL VIEWS/BREAST US AS REQ BY RAD   2. OSA on CPAP  G47.33     Z99.89    3. Chronic left-sided lumbar radiculopathy  M54.16    4. BMI 45.0-49.9, adult  (CMS [facility])  [phone] Refer to [facility] or Bariatric Surgery   5. Mood disorder (CMS HCC)  F39    6. Gastroesophageal reflux disease, unspecified whether esophagitis present  K21.9    7. Bullous pemphigoid  L12.0    8. Seasonal allergic rhinitis, unspecified trigger  J30.2      1. Encounter for screening for malignant neoplasm of breast, unspecified screening modality  - MAMMO BILATERAL SCREENING-ADDL VIEWS/BREAST US AS REQ BY RAD; Future    2. OSA on CPAP  Chronic, controlled. Continue CPAP.     3. Chronic left-sided lumbar radiculopathy  Chronic. Stable symptoms  Continue gabapentin, controlled substance agreement signed.   Patient does not need refills at this time.     4. BMI 45.0-49.9, adult (CMS HCC)  Chronic, uncontrolled.   - currently on contrave, no improvement in weight and patient notes side effects.   - Refer to [facility] Loss or Bariatric Surgery; Future for further evaluation and management.     5. Mood disorder (CMS HCC)  Chronic, stable mood.   Continue Celexa.   F/u 6 months.     6. Gastroesophageal reflux disease, unspecified whether esophagitis present  Chronic, stable. Continue pantoprazole.    7. Bullous pemphigoid  Chronic, uncontrolled. Continue scheduled appointment to establish with Dermatology for ongoing management.     8. Seasonal allergic rhinitis, unspecified  trigger  Chronic, stable. Continue Dulera.     Return in 3 months (on 03/02/2021) for Wellness.    Health Maintenance: Pending and Last Completed       Date Due Completion Date    Covid-19 Vaccine (1) Never done ---    Pneumococcal Vaccine, Age 5-64 (1 - PCV) Never done ---    Depression Screening Never done ---    HIV Screening Never done ---    Shingles Vaccine (1 of 2) Never done ---    Pap smear Never done ---    Adult Tdap-Td (1 - Tdap) 05/27/2006 05/27/1996    Colonoscopy Never done ---    Mammography Never done ---    Influenza Vaccine (1) 01/25/2021 04/08/2013        MEDICAL HISTORY   Review of  Systems:  Review of Systems   Respiratory: Negative for shortness of breath.    Cardiovascular: Negative for chest pain.   Gastrointestinal: Negative.      Pertinent discussed in HPI, otherwise all other systems negative.     Past Medical History:   Diagnosis Date   . Allergic rhinitis    . Anxiety    . Depression    . Esophageal reflux    . Hypercholesterolemia    . Hypertension     due to Contrave     Family Medical History:     Problem Relation (Age of Onset)    Alcohol abuse Mother    Cirrhosis Mother    No Known Problems Father    Ovarian Cancer Mother        Social History     Tobacco Use   . Smoking status: Never Smoker   . Smokeless tobacco: Never Used   Vaping Use   . Vaping Use: Never used   Substance Use Topics   . Alcohol use: Not Currently   . Drug use: Never     Allergies   Allergen Reactions   . Latex Rash   . Peanut    . Shellfish Derived    . Whey      Outpatient Medications Marked as Taking for the 11/30/20 encounter (Office Visit) with Antoine Poche, APRN,FNP-BC   Medication Sig   . cholecalciferol, vitamin D3, 50 mcg (2,000 unit) Oral Tablet Take 2,000 Units by mouth Once a day   . citalopram (CELEXA) 20 mg Oral Tablet Take 20 mg by mouth Once a day   . dupilumab (DUPIXENT PEN) 300 mg/2 mL Subcutaneous Pen Injector Inject 300 mg under the skin Every 14 days   . gabapentin (NEURONTIN) 300 mg Oral Capsule Take 300 mg by mouth Three times a day   . hydroCHLOROthiazide (MICROZIDE) 12.5 mg Oral Capsule Take 12.5 mg by mouth Once a day   . mometasone-formoterol (DULERA) 100-5 mcg/dose Inhalation HFA Aerosol Inhaler Take 2 Puffs by inhalation Twice daily   . naltrexone-bupropion (CONTRAVE) 8-90 mg Oral Tablet Sustained Release Take 2 Tablets by mouth Twice daily   . pantoprazole (PROTONIX) 40 mg Oral Tablet, Delayed Release (E.C.) Take 40 mg by mouth Once a day   . predniSONE (DELTASONE) 20 mg Oral Tablet Take 20 mg by mouth Once a day   . silver sulfADIAZINE (SILVADENE) 1 % Cream Apply topically Twice  daily     The patient's medications were reviewed and updated at the end of the clinic visit today.  OBJECTIVE   BP 122/84   Pulse 94   Temp 36.4 C (97.6 F) (Thermal Scan)   Ht  1.575 m (5\' 2" )   Wt 117 kg (257 lb 8 oz)   BMI 47.10 kg/m       Weight/BMI:  Wt Readings from Last 5 Encounters:   11/30/20 117 kg (257 lb 8 oz)      Physical Exam  Vitals reviewed.   Constitutional:       General: She is not in acute distress.  Eyes:      Pupils: Pupils are equal, round, and reactive to light.   Cardiovascular:      Rate and Rhythm: Normal rate and regular rhythm.   Pulmonary:      Effort: Pulmonary effort is normal. No respiratory distress.      Breath sounds: Normal breath sounds.   Abdominal:      General: Bowel sounds are normal. There is no distension.      Palpations: Abdomen is soft.      Tenderness: There is no abdominal tenderness.   Skin:     Findings: Lesion (multiple diffusly to entire body) present.   Neurological:      Mental Status: She is alert and oriented to person, place, and time.   Psychiatric:         Mood and Affect: Mood normal.         Behavior: Behavior normal.         Laboratory Studies/Data review:  Not Applicable to today's encounter      MRI Spine lumbar completed 11/2016 results reviewed:  IMPRESSION: L5-S1 disc protrusion producing asymmetric mass effect upon the traversing right S1 nerve root. There is contacting of both exiting L5 nerve roots laterally.         Lab A1C Results:  No results found for: HA1C    POCT A1C Results:  No flowsheet data found.    No results found for: POCTHA1C    No results found for: TRIG, HDLCHOL, LDLCHOL, CHOLESTEROL    The ASCVD Risk score 12/2016 DC Jr., et al., 2013) failed to calculate for the following reasons:    Cannot find a previous HDL lab    Cannot find a previous total cholesterol lab    2014, APRN,FNP-BC  11/30/2020, 07:42  Patient seen independently.   01/31/2021, APRN,FNP-BC  [facility], [facility]  Operated by [facility]  [address]  [place]  Dept: [phone]  Dept Fax: [phone]    This document was generated using a voice recognition system and transcription. All documents are proofed as best as possible, but it may have misspelled words, incorrect words, or syntax and grammatical errors because of the imperfect nature of the system.

## 2020-12-21 ENCOUNTER — Ambulatory Visit (HOSPITAL_BASED_OUTPATIENT_CLINIC_OR_DEPARTMENT_OTHER): Payer: Self-pay

## 2021-02-16 ENCOUNTER — Ambulatory Visit: Payer: Managed Care, Other (non HMO) | Attending: Registered Nurse | Admitting: Internal Medicine

## 2021-02-16 ENCOUNTER — Encounter (INDEPENDENT_AMBULATORY_CARE_PROVIDER_SITE_OTHER): Payer: Self-pay

## 2021-02-16 ENCOUNTER — Encounter (INDEPENDENT_AMBULATORY_CARE_PROVIDER_SITE_OTHER): Payer: Self-pay | Admitting: Internal Medicine

## 2021-02-16 ENCOUNTER — Other Ambulatory Visit: Payer: Self-pay

## 2021-02-16 VITALS — BP 134/82 | HR 107 | Temp 96.6°F | Ht 61.22 in | Wt 259.3 lb

## 2021-02-16 DIAGNOSIS — F39 Unspecified mood [affective] disorder: Secondary | ICD-10-CM | POA: Insufficient documentation

## 2021-02-16 DIAGNOSIS — Z6841 Body Mass Index (BMI) 40.0 and over, adult: Secondary | ICD-10-CM | POA: Insufficient documentation

## 2021-02-16 NOTE — Patient Instructions (Addendum)
Goals     . Medical Weight Mangament      Healthy snacks to decrease hunger and overeating at meal time  Increase water intake   Walk 10 minutes 3 times a week                Please call your insurance and see if they cover Saxenda, Wegovy or Ozempic for weight loss

## 2021-02-16 NOTE — H&P (Signed)
Medical Weight Management at [place] [facility]  New Patient Visit  Subjective      Subjective      Sherry Matthews is a 50 y.o. female here to discuss medical weight management and weight-related health conditions.      Weight at start of medical weight management treatment plan (02/16/21 259 lb): Obesity Class 3  Weight today, 02/16/2021: Body mass index is 48.64 kg/m.  Weight: 118 kg (259 lb 4.2 oz) (waist 49.5in : neck 14.5in)  Highest lifetime weight (pounds): 275 lbs   Lowest weight after bariatric surgery, if applicable (pounds): n/a        Weight history     General:   Average weight in childhood but reports she always had to closely monitor her food  Weight gain started around 19 after getting married  Has trouble with emotional eating with depression  Limited diet due to food allergies  Motivation for wanting to lose weight? Health, improving back pain  Past weight loss diet attempts/successes/meds tried?   Weight Watchers - not effective because has difficulty with logging and tallying points  Atkins - lost some but had headaches  Contrave - has been on x 3 years; lost 20 lbs initially and has maintained       Dietary, behavior, physical activity, sleep factors     Diet, behavior, physical activity factors: see scanned form/summary in note assessment below.     AUDIT C score for alcohol intake: 0 drinks with alcohol    PROMIS sleep disturbance score: PROMIS sleep disturbance score: 35, 30-37: moderate                    Social history     Household members? Son - somewhat supportive  Tobacco/marijuana? See scanned form   Education and/or Occupation? See scanned form        Family history     See scanned form      Past Medical and Surgical history       Past Medical History:   Diagnosis Date   . Allergic rhinitis    . BMI 45.0-49.9, adult (CMS HCC)    . Bullous pemphigoid    . Chronic left-sided lumbar radiculopathy 11/30/2020   . Esophageal  reflux    . Hypertension     due to Contrave   . Mood disorder (CMS HCC)    . OSA on CPAP 11/30/2020           Past Surgical History:   Procedure Laterality Date   . ADENOIDECTOMY     . HX CARPAL TUNNEL RELEASE     . HX CHOLECYSTECTOMY     . LEG SURGERY Right 2017    tib fib HW, s/p fall off ladder         Medications Reviewed     cholecalciferol, vitamin D3, 50 mcg (2,000 unit) Oral Tablet, Take 2,000 Units by mouth Once a day  citalopram (CELEXA) 20 mg Oral Tablet, Take 20 mg by mouth Once a day  dupilumab (DUPIXENT PEN) 300 mg/2 mL Subcutaneous Pen Injector, Inject 300 mg under the skin Every 14 days (Patient not taking: Reported on 02/16/2021)  gabapentin (NEURONTIN) 300 mg Oral Capsule, Take 300 mg by mouth Three times a day  hydroCHLOROthiazide (MICROZIDE) 12.5 mg Oral Capsule, Take 12.5 mg by mouth Once a day  minocycline (MINOCIN) 100 mg Oral Capsule,   mometasone-formoterol (DULERA) 100-5 mcg/dose Inhalation HFA Aerosol Inhaler, Take 2 Puffs by inhalation Twice daily  naltrexone-bupropion (CONTRAVE) 8-90 mg  Oral Tablet Sustained Release, Take 2 Tablets by mouth Twice daily  Niacinamide (NICOMIDE-T) 500 mg Oral Tablet, Take 500 mg by mouth Once a day  pantoprazole (PROTONIX) 40 mg Oral Tablet, Delayed Release (E.C.), Take 40 mg by mouth Once a day  predniSONE (DELTASONE) 20 mg Oral Tablet, Take 20 mg by mouth Once a day (Patient not taking: Reported on 02/16/2021)  predniSONE (DELTASONE) 5 mg Oral Tablet, 10 mg  silver sulfADIAZINE (SILVADENE) 1 % Cream, Apply topically Twice daily    No facility-administered medications prior to visit.          Review of Systems     See scanned form.     Behavioral health screenings     Scoff disordered eating: score 2, (score of 2 or more is positive)    GAD7 anxiety: score 9, (score 5-9=mild symptoms)    PHQ9 depression: score 20, (score 20-27=severe)    BEDS 7 binge eating: positive (yes to 2nd question and any of the questions following it)          Objective    Objective        Physical exam     BP 134/82   Pulse (!) 107   Temp 35.9 C (96.6 F) (Thermal Scan)   Ht 1.555 m (5' 1.22")   Wt 118 kg (259 lb 4.2 oz) Comment: waist 49.5in : neck 14.5in  SpO2 97%   BMI 48.64 kg/m         Body mass index is 48.64 kg/m.    Waist circumference: 49.5 inches  Neck circumf. (>17 men, >15 women assoc. w sleep apnea): 14.5 inches  General: No acute distress  Body weight distribution: both upper and lower body weight distribution  Acanthosis: acanthosis nigricans absent  Heart: Heart regular rate and rhythm without murmur  Lungs: Lungs clear to auscultation bilaterally  Abdomen: Abdomen is soft and non-tender  Lower extremity exam: Lower extremity edema is absent   Skin: diffuse rash on upper and lower extremities  '[]'  Limited telemed exam      Labs     No results found for: CHOLESTEROL, HDLCHOL, LDLCHOL, LDLCHOLDIR, TRIG     No results found for: HA1C    No results found for: GLUCOSEFAST     No results found for: TSH         CBC  Diff   No results found for: WBC, WBCJ, HGB, HCT, PLTCNT, SEDRATE, ESR, RBC, MCV, MCHC, MCH, RDW, MPV No results found for: PMNS, LYMPHOCYTES, EOSINOPHIL, MONOCYTES, BASOPHILS, PMNABS, LYMPHSABS, EOSABS, MONOSABS, BASOSABS, BASABS                   Assessment    Assessment         50 y.o. female is seeing Medical Weight Management because of the clinic's goal to help patients improve weight-related health conditions, lose weight, and make permanent lifestyle changes.      Insurance coverage considerations: [employer]        Has obesity complicated by the following conditions: Hypertension, GERD, Depression/anxiety and Joint pain/back pain/osteoarthritis        Multifactorial contributors to excess weight: See scanned form or list here Eating quickly, Eating mindlessly, Emotional eating, Addictive food tendencies, Hunger, Problem foods or foods that trigger overeating, Processed convenience foods, Less than 5 servings fruits and vegetables daily, Sweets, Poor support  system, Tempted by food environment         Weight at start of treatment plan: 02/16/21 259 lb  Weight loss outcome goals:    Self-determined long-term goal: achieve a weight of <200 lbs   Realistic first goal (10% weight loss in 6 months): lose 25 lbs   Short-term goal:1-2 lbs/week       Plan     Food     Goal is satisfying individualized eating pattern that can be maintained for life.   Healthy eating patterns include vegetables, protein, and healthy fats from high quality whole foods.   Drinking mostly water is best because drinking calories is high risk for weight gain.   Focus on incorporating positive nutritional foods to naturally minimize intake of sugar and processed carbs.     Nutrition prescription: per Registered Dietitian with patient input to identify preferences: Flexible healthy eating        Movement     Physical activity is important for health, but weight loss comes more from food changes whereas activity helps with weight maintenance. (Loss requires very high amounts activity.)    Walking does not require special equipment or money, and can be tracked with a pedometer or phone app.     Increase activity gradually as tolerated: it does not need to be done at one time and does not need to be from formal exercise.    Physical activity choice (enjoyable preferred): Walking - details shorter periods of time due to back problems      Behavior: 4 S's of behavior change for weight management       Self-monitoring associated with success so we recommend tracking food, movement, sleep, and weight, and for best results, bring logs to visits for review. Choice: paper      Support, such as from behavior change program(s) (optional, note if any): None of the above  Regular visits also provide support even during setbacks.   Medical Weight Management Facebook support group can provide additional support: [email or web address]      Stimulus control: A healthy food environment is  important for setting oneself up for success.        Setting goals:  Behavior change self-determined goals:       Goals Addressed                 This Visit's Progress    . Medical Weight Mangament        Healthy snacks to decrease hunger and overeating at meal time  Increase water intake   Walk 10 minutes 3 times a week                    Medical     Check labs? None: will try to obtain outside lab records      Psychology referral (such as for behavioral health screening scores, night eating, disordered eating): None.  Patient declines referral    Genetic testing for rare genetic causes of obesity? does not qualify    Health promotion and prevention:   COVID-19 vaccinated? yes    Sleep: Good sleep, at least 7-8 hours per day, is important for improving health and optimizing weight loss.               Surgical/procedural treatment preferences? Maybe later would consider surgical/procedural options      Medications     Medication treatment preferences?  Would consider     (Optional conception plans--only include for those with childbearing potential): Not trying to conceive AND adequate pregnancy prevention    Meds associated with weight gain:   Prednisone - reports she is  only supposed to be on prednisone for 1 more week but has had intermittent courses of prednisone since May for bullous pemphigoid  Celexa - she will discuss with PCP change to Prozac or Zoloft if she feels appropriate    Past meds tried that are favorable for weight management:   Has been on Contrave for 3 years with 20 lb initial weight loss and reports mood significantly worsens if off Contrave    Related disorders (DM, Hypothyroid, headaches, depression) or contraindications that may affect med decisions: Depression, HTN (she attributes to being on Contrave)      Medication change and treatment plan summary (AOMs, deprescribing, med optimization):  Discussed stopping Contrave since she states that it has caused her high blood pressure in the  past.  She is hesitant to stop due to significantly worsened mood in the past.  Can continue for now  Discussed GLP-1 agonist and she will contact insurance to see if they cover semaglutide or liraglutide.          Return for F/U 1-2 weeks new RD, 1 month RET me/APP, F/U by video.        No orders of the defined types were placed in this encounter.        Sharlyn Bologna, MD  02/16/2021, 09:57  [facility]: [email or web address]  Facebook support group: [email or web address]              Total time spent on date of service was 75 minutes, which includes some or all of the following: reviewing records, obtaining the history, counseling, goal-setting, placing orders, coordinating care, and documenting.

## 2021-02-19 NOTE — Progress Notes (Unsigned)
TELEMEDICINE DOCUMENTATION:    Patient Location: Sherry Matthews    Patient/family aware of provider location:yes  Patient/family consent for telemedicine:yes  Examination observed and performed by: Sharyn Lull RD LD  [facility]   Operated by [facility]  New Patient Nutrition Assessment  Patient Name:Sherry Matthews   MRN: [id number]     DOB:1971-05-13     Age: 50 y.o.      Assessment  Subjective:  Patient is being seen at this time for medical nutrition therapy counseling for weight management and related conditions. Has established as a new patient in our Medical Weight Management Program clinic on 02/16/21. Sherry Matthews recently moved to [facility] from [place] about 2 months ago and is starting to get established with new providers    Multiple food allergens were discussed. None of these allergens produce anaphylactic symptoms. Most result in breakouts on her skin (facial) and higher intakes cause excessive sleepiness. She did have allergy testing done by an allergist in [place]. Discussed establishing care here and retesting that could possibly be done in the future.      Wt and diet hx: Started to experience weight gain after marriage. Suffers with emotional eating and depression. Has multiple food allergens noted. Highest weight has been 275#. She has tried [employer] ans [employer] in the past. She has been on Contrave and has lost 20# and maintained this. Feels her mood deteriorates if she is off contrave. Wellbutrin has been very helpful for her mental health for many years. Prior to taking it she was sleeping excessively and was unable to function well.  Social Hx:Her 90 yo son lives with her. He is helping her with household chores while he looks for a job/school. He shops and prepares meals. 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 She is very focused on work and has difficulty taking breaks away from her work. She will rarely stop working during the day to drink/eat and only uses the bathroom when she  really needs to. She realizes this is not healthy overall.  Food Insecurity: no  Sleep issues: yes. She has treated OSA but poor sleep  Stress level: stress over health and money create anxiety for her. She feels she is a stress eater (chips/ice cream)    Nutrition/food Behaviors:    Typical eating pattern: irregular; cannot eat full meals; does not want to stop working and will forget to eat/drink    B: 2-3 pc white toast with butter ([place])  L: anything she can just heat up. Often pizza  Or may be leftovers for dinner  D: chili, spaghetti, chicken, pork chops, steak. Not always a side dish  Snacks: night time: ice cream, chips. If she eats this she often will not eat dinner. Usually one or the other. Does not eat later than 6:30-7pm  Beverages: Drinks a sweet tea (16oz) every morning. Has tried alternatives to sweetened tea but does not like them and they give her headaches. Drinks 1-2 bottles of water daily    Adequate intake of fruits and vegetables (5 a day+) []   Chooses whole grains  []   Adequate intake of lean proteins to meet estimated needs (plant or animal based)[]   Adequate intake of dairy or equivalent (2-3 servings per day)[]  Allergic to whey  Choosing heart healthy fats often (mono/polyunsaturated) []   Intake of sugar sweetened beverages [x]     Intake of alcohol [x]  Never []  Rare  []  Moderation  []  Excess    Meal planning/preparation:  no planning or pre prep. Son goes to the store for her. Can create a list of healthy foods for him to purchase  Eating consistent meals/snacks: no  Portions: small  Eating until over full: No. Often does not eat much of her meal and feels bad that her son has prepared it and she did not eat much  Eating too fast: yes  Reading nutrition facts labels: for allergens  Dining out/take out: no  Unintentional eating (mindless/boredom eating): yes. Has hx of eating to stay awake in the car while driving when she used to have to commute to work.  Emotional/stress eating:  yes; chooses chips/ice cream  Trigger foods: yes  Cravings/addictive food behaviors: yes      Physical Activity: physical limitations impact ability to exercise. Has a very sedentary job and she does not get up from her desk unless necessary. Would like to change this      Barriers to healthy eating/weight loss: family hx of obesity, limited ability to exercise, sedentary job, deporession anxiety, poor sleep, stress, feels the need to work continually without taking a break, needing to watch food purchases for allergens.      Objective  Medical History:   Past Medical History:   Diagnosis Date   . Allergic rhinitis    . BMI 45.0-49.9, adult (CMS HCC)    . Bullous pemphigoid    . Chronic left-sided lumbar radiculopathy 11/30/2020   . Esophageal reflux    . Hypertension     due to Contrave   . Mood disorder (CMS HCC)    . OSA on CPAP 11/30/2020           Surgical History:  Past Surgical History:   Procedure Laterality Date   . ADENOIDECTOMY     . HX BUNIONECTOMY     . HX CARPAL TUNNEL RELEASE     . HX CHOLECYSTECTOMY     . LEG SURGERY Right 2017    tib fib HW, s/p fall off ladder       Current medications:   Current Outpatient Medications   Medication Sig   . cholecalciferol, vitamin D3, 50 mcg (2,000 unit) Oral Tablet Take 2,000 Units by mouth Once a day   . citalopram (CELEXA) 20 mg Oral Tablet Take 20 mg by mouth Once a day   . dupilumab (DUPIXENT PEN) 300 mg/2 mL Subcutaneous Pen Injector Inject 300 mg under the skin Every 14 days (Patient not taking: Reported on 02/16/2021)   . gabapentin (NEURONTIN) 300 mg Oral Capsule Take 300 mg by mouth Three times a day   . hydroCHLOROthiazide (MICROZIDE) 12.5 mg Oral Capsule Take 12.5 mg by mouth Once a day   . minocycline (MINOCIN) 100 mg Oral Capsule    . mometasone-formoterol (DULERA) 100-5 mcg/dose Inhalation HFA Aerosol Inhaler Take 2 Puffs by inhalation Twice daily   . naltrexone-bupropion (CONTRAVE) 8-90 mg Oral Tablet Sustained Release Take 2 Tablets by mouth Twice  daily   . Niacinamide (NICOMIDE-T) 500 mg Oral Tablet Take 500 mg by mouth Once a day   . pantoprazole (PROTONIX) 40 mg Oral Tablet, Delayed Release (E.C.) Take 40 mg by mouth Once a day   . predniSONE (DELTASONE) 20 mg Oral Tablet Take 20 mg by mouth Once a day (Patient not taking: Reported on 02/16/2021)   . predniSONE (DELTASONE) 5 mg Oral Tablet 10 mg   . silver sulfADIAZINE (SILVADENE) 1 % Cream Apply topically Twice daily       Meds relevant to weight  management:   Gabapentin, Celexa, and prednisone an favor weight gain  Contrave favors weight loss and this has been helpful in the past    Current laboratory values reviewed:    No results found for: SODIUM, POTASSIUM, CHLORIDE, CO2, ANIONGAP, BUN, CREATININE, GLUCOSE  No results found for: CALCIUM, PHOSPHORUS, TOTALPROTEIN, AST, ALT  No results found for: IRON  No results found for: B12  No results found for: TRIG, HDLCHOL, LDLCHOL, CHOLESTEROL  No results found for: HA1C     She reports her last Hgb A1C on 08/05/19 was 4.9    FOOD allergies/food intolerances:   Noted allergens:   carrots  Eggs (all)  Peanuts (including cross contaminated foods)  Shellfish (feels eating farm raised makes a difference)  Whey  Lima beans  Broccoli makes her sick (intolerance)    These allergens are dose dependent. Can have small amounts. No anaphylactic s/s    Supplement intake (vit/min/herbals): n/a    Today's Vitals  There were no vitals taken for this visit.      Wt Readings from Last 6 Encounters:   02/16/21 118 kg (259 lb 4.2 oz)   11/30/20 117 kg (257 lb 8 oz)       Estimate energy requirement per Mifflin-St. Jeor  Formula:  Female REE: 1734 kcals  Multiply REE X Activity Factor (1.2) = 2080 kcal/day          Minus 500 kcal to 750 kcal/day to promote weight loss: 1330-1580 Kcal    Protein needs: 78-98g (1.2-1.5g/Kg Adj bw)    Diagnosis    PES statement  Obesity class III related to excess energy intake, emotional eating, +SCOFF,+BEDS7, poor sleep, stress, physical  inactivity/sedentary behavior as evidenced by patient interview and BMI 48.64    Interventions    Nutrition Prescription: 1400-1500 Kcal flexible healthy eating plan based on the principles of the [employer] Healthy Eating Plate  Discussed Healthy Eating Plate guidelines for meal planning:    1/2 plate vegetables and fruit   1/4 plate whole grains/starchy vegetables  1/4 plate lean protein  1 serving healthy fat      Nutrition Ed: Discussed nutritional needs for weight management, encouraging nutrient rich food choices, using the healthy eating plate as a guideline, reading food labels to to help make healthy food choices and avoid allergens, and healthy meal planning and preparation to fit personal lifestyle. She feels her priorities are convenience, eliminating allergens, snack ideas, and appropriate portions.  Possible meal/snack changes: Discussed planning for shopping and keeping convenient, healthy foods in the pantry/fridge/freezer to pull from quickly. Also encouraged fluid intake  Behavioral strategies: Discussed tracking intake for self monitoring (did write down yesterday morning's meal), portion awareness, and stimulus control (keeping a healthy food environment).      Monitoring and Evaluation    Lifestyle goals identified during initial assessment to be monitored:   1. Increase water intake to 3 bottles per day. Will set alarm in her phone to remember to drink  2. Will set alarm in phone or computer to remind her to get up from her desk and stand/move (aim for 5 min every hour)  3. Will split lunch into two small meals. One will include a fruit and the other will include a vegetable.  4. Will create a grocery list for her son that includes healthy convenience foods as discussed. Resources provided  F/U: Pt understands that they can contact me via My Chart at any time for questions, concerns, or to schedule follow up as  needed.    Will f/u in Nov   Time spent counseling patient: 80 min    Elwin Sleight,  RDLD 02/19/2021, 18:32

## 2021-02-20 ENCOUNTER — Encounter (INDEPENDENT_AMBULATORY_CARE_PROVIDER_SITE_OTHER): Payer: Self-pay | Admitting: Registered"

## 2021-02-20 ENCOUNTER — Ambulatory Visit: Payer: Managed Care, Other (non HMO) | Admitting: Registered"

## 2021-02-20 DIAGNOSIS — G4733 Obstructive sleep apnea (adult) (pediatric): Secondary | ICD-10-CM | POA: Insufficient documentation

## 2021-02-20 DIAGNOSIS — Z9989 Dependence on other enabling machines and devices: Secondary | ICD-10-CM | POA: Insufficient documentation

## 2021-02-20 DIAGNOSIS — Z6841 Body Mass Index (BMI) 40.0 and over, adult: Secondary | ICD-10-CM | POA: Insufficient documentation

## 2021-02-22 ENCOUNTER — Encounter (INDEPENDENT_AMBULATORY_CARE_PROVIDER_SITE_OTHER): Payer: Self-pay

## 2021-03-02 ENCOUNTER — Ambulatory Visit: Payer: Managed Care, Other (non HMO) | Attending: Registered Nurse | Admitting: Registered Nurse

## 2021-03-02 ENCOUNTER — Encounter (HOSPITAL_BASED_OUTPATIENT_CLINIC_OR_DEPARTMENT_OTHER): Payer: Self-pay | Admitting: Registered Nurse

## 2021-03-02 ENCOUNTER — Other Ambulatory Visit: Payer: Self-pay

## 2021-03-02 VITALS — BP 134/86 | HR 103 | Temp 98.3°F | Ht 62.0 in | Wt 260.8 lb

## 2021-03-02 DIAGNOSIS — Z1211 Encounter for screening for malignant neoplasm of colon: Secondary | ICD-10-CM

## 2021-03-02 DIAGNOSIS — M5416 Radiculopathy, lumbar region: Secondary | ICD-10-CM

## 2021-03-02 DIAGNOSIS — L120 Bullous pemphigoid: Secondary | ICD-10-CM

## 2021-03-02 DIAGNOSIS — I158 Other secondary hypertension: Secondary | ICD-10-CM

## 2021-03-02 DIAGNOSIS — J302 Other seasonal allergic rhinitis: Secondary | ICD-10-CM

## 2021-03-02 DIAGNOSIS — F39 Unspecified mood [affective] disorder: Secondary | ICD-10-CM

## 2021-03-02 DIAGNOSIS — Z124 Encounter for screening for malignant neoplasm of cervix: Secondary | ICD-10-CM | POA: Insufficient documentation

## 2021-03-02 DIAGNOSIS — K219 Gastro-esophageal reflux disease without esophagitis: Secondary | ICD-10-CM

## 2021-03-02 DIAGNOSIS — Z0000 Encounter for general adult medical examination without abnormal findings: Secondary | ICD-10-CM

## 2021-03-02 DIAGNOSIS — Z6841 Body Mass Index (BMI) 40.0 and over, adult: Secondary | ICD-10-CM

## 2021-03-02 MED ORDER — DULERA 100 MCG-5 MCG/ACTUATION HFA AEROSOL INHALER
2.0000 | INHALATION_SPRAY | Freq: Two times a day (BID) | RESPIRATORY_TRACT | 2 refills | Status: DC
Start: 2021-03-02 — End: 2021-03-08

## 2021-03-02 MED ORDER — HYDROCHLOROTHIAZIDE 12.5 MG CAPSULE
12.5000 mg | ORAL_CAPSULE | Freq: Every day | ORAL | 3 refills | Status: DC
Start: 2021-03-02 — End: 2022-03-26

## 2021-03-02 MED ORDER — PANTOPRAZOLE 40 MG TABLET,DELAYED RELEASE
40.0000 mg | DELAYED_RELEASE_TABLET | Freq: Every day | ORAL | 3 refills | Status: DC
Start: 2021-03-02 — End: 2022-04-29

## 2021-03-02 MED ORDER — CITALOPRAM 20 MG TABLET
20.0000 mg | ORAL_TABLET | Freq: Every day | ORAL | 3 refills | Status: DC
Start: 2021-03-02 — End: 2022-04-05

## 2021-03-02 MED ORDER — GABAPENTIN 300 MG CAPSULE
300.0000 mg | ORAL_CAPSULE | Freq: Three times a day (TID) | ORAL | 2 refills | Status: DC
Start: 2021-03-02 — End: 2021-05-24

## 2021-03-02 NOTE — Patient Instructions (Signed)
Lab work ordered. Please do not eat or drink anything for 12-16 hours before having labs completed.

## 2021-03-02 NOTE — Progress Notes (Signed)
[facility]: Family Medicine Office Visit  Annual Well Women Visit      Service date: 03/02/2021:   Birth date: 02-26-1971    Sherry Matthews is a 50 y.o. female who presents to [facility] today with chief complaint of Annual Wellness Exam   annual well exam.   Please see separate section for any listed chronic or acute medical diagnosis if indicated.   Health Care Maintenance / HPI   Overall she feels well and has no concerns about her body or health today.     1. Mood disorder. Currently on Celexa for past year. Was on Lexapro prior for 20+ years, stopped due to weight gain.  Mood doing well. Denies s/e of medication.     2. Bullous Pemphigoid, established with [facility] for management.     3. Lumbar DDD with left leg radiculopathy. Taking gabapentin for symptoms control, takes mostly at night as daytime makes her very tired.     4. OSA wears CPAP nightly.     5. Established with medical weight clinic. Previous PCP ordered Contrave for weight loss, on for 3 years. Only medication she has tried. May try alternative medication. On HCTZ for BP management due to contrave.     - PHQ-2 Depression screening  Over the past 2 weeks have you been bother by:  Little interest or pleasure in doing things.: Not at all  Feeling down, depressed, or hopeless: Not at all  PHQ 2 Total: 0   reports that she has never smoked. She has never used smokeless tobacco. She reports previous alcohol use. She reports that she does not use drugs.  -Alcohol Misuse: no  -Diet History: well balanced  -Exercise History: working on increasing  -STI Screening-Any known exposure/risk to [employer]:  no  -Routine Eye and Dentist Visits :advised to schedule    -Cervical cancer (every 3 yrs if negative age 104-60yrs PAP only or every 5 yrs age 54-65 with cotesting, >65 d/c if past 10 years negative): Ordered Hx of abnormal pap smears: yes 32+ years old suspected ASCUS  -Annual / biannual Mammography: Scheduled.   -Colorectal  cancer: Patient chooses colonoscopy, order pending.    Vaccination records requested   Health Maintenance: Pending and Last Completed       Date Due Completion Date    Pneumococcal Vaccine, Age 91-64 (1 - PCV) Never done ---    Shingles Vaccine (1 of 2) Never done ---    Pap smear Never done ---    Adult Tdap-Td (1 - Tdap) 05/27/2006 05/27/1996    Colonoscopy Never done ---    Mammography Never done ---    Covid-19 Vaccine (3 - Moderna risk series) 12/03/2019 11/05/2019    Influenza Vaccine (1) 01/25/2021 04/08/2013    HIV Screening 03/11/2022 (Originally 09/04/1983) ---    Depression Screening 03/02/2022 03/02/2021        MEDICAL HISTORY     Review of Systems:  Constitutional: no fever, chills, or significant weight changes.  Eyes: negative for vision changes.  Ears, nose, mouth, throat, and face: negative for ear pain or sore throat.   Respiratory: negative for dyspnea.  Cardiovascular: negative for chest pain.  Gastrointestinal: negative for nausea, vomiting, bowel changes.  Genitourinary:negative for dysuria, hematuria.    Integument/breast: negative for rashes.  Musculoskeletal:negative for new myalgias or arthralgias.   Neurological: no muscle weakness.   Behvioral/Psych: negative for depression and anxiety.    Past Medical History:   Diagnosis Date   .  Allergic rhinitis    . BMI 45.0-49.9, adult (CMS HCC)    . Bullous pemphigoid    . Chronic left-sided lumbar radiculopathy 11/30/2020   . Esophageal reflux    . Hypertension     due to Contrave   . Mood disorder (CMS HCC)    . OSA on CPAP 11/30/2020         Social History     Tobacco Use   . Smoking status: Never Smoker   . Smokeless tobacco: Never Used   Vaping Use   . Vaping Use: Never used   Substance Use Topics   . Alcohol use: Not Currently   . Drug use: Never     Allergies   Allergen Reactions   . Latex Rash   . Carrot Rash   . Eggs    . Peanut    . Shellfish Derived    . Whey    . Lima Bean Hives/ Urticaria       Outpatient Medications Marked as Taking for the  03/02/21 encounter (Office Visit) with Antoine Poche, APRN,FNP-BC   Medication Sig   . cholecalciferol, vitamin D3, 50 mcg (2,000 unit) Oral Tablet Take 2,000 Units by mouth Once a day   . citalopram (CELEXA) 20 mg Oral Tablet Take 1 Tablet (20 mg total) by mouth Once a day   . gabapentin (NEURONTIN) 300 mg Oral Capsule Take 1 Capsule (300 mg total) by mouth Three times a day   . hydroCHLOROthiazide (MICROZIDE) 12.5 mg Oral Capsule Take 1 Capsule (12.5 mg total) by mouth Once a day   . minocycline (MINOCIN) 100 mg Oral Capsule    . mometasone-formoterol (DULERA) 100-5 mcg/dose Inhalation HFA Aerosol Inhaler Take 2 Puffs by inhalation Twice daily Indications: controller medication for asthma   . naltrexone-bupropion (CONTRAVE) 8-90 mg Oral Tablet Sustained Release Take 2 Tablets by mouth Twice daily   . Niacinamide (NICOMIDE-T) 500 mg Oral Tablet Take 500 mg by mouth Once a day   . pantoprazole (PROTONIX) 40 mg Oral Tablet, Delayed Release (E.C.) Take 1 Tablet (40 mg total) by mouth Once a day       The patient's medications were reviewed and updated at the end of the clinic visit today.  OBJECTIVE   BP 134/86   Pulse (!) 103   Temp 36.8 C (98.3 F) (Thermal Scan)   Ht 1.575 m (5\' 2" )   Wt 118 kg (260 lb 12.9 oz)   SpO2 97%   BMI 47.70 kg/m     No LMP recorded. (Menstrual status: Irregular cycle).Body mass index is 47.7 kg/m.  Wt Readings from Last 3 Encounters:   03/02/21 118 kg (260 lb 12.9 oz)   02/16/21 118 kg (259 lb 4.2 oz)   11/30/20 117 kg (257 lb 8 oz)     BP Readings from Last 3 Encounters:   03/02/21 134/86   02/16/21 134/82   11/30/20 122/84     Physical examination:  General: Well-developed and well-nourished female, well-appearing and in no acute distress.  Eyes: Pupils equal, round, and reactive to light and accommodation. No conjunctival injection or scleral icterus noted.  HENT: TMs appear normal. Mucous membranes moist. Oropharynx without erythema or exudate. No maxillary or frontal sinus  tenderness.   Neck: No lymphadenopathy or thyromegaly.  Lungs: Clear to auscultation bilaterally.    Cardiovascular: Regular rate and rhythm. No murmurs, rubs, or gallops.  Pulses 2+ and symmetric.   Abdomen: Soft, Bowel sounds present in all 4 quadrants. No organomegaly  noted.   Genitourinary: No suprapubic or CVA tenderness.   Speculum:  Vaginal mucosa appears normal without lesions. Cervix visualized and is without lesions or irritation. No abnormal discharge is present.  Musculoskeletal: Well perfused and without deformity or edema. Full ROM. Strength 5/5 throughout.   Skin: Warm and dry. No rashes present.  Neurologic: grossly intact. Sensation intact and symmetric to light touch. Normal tone. Steady gait.   Psychiatric: Alert and oriented to person, place, and time. Pleasant and cooperative with exam. Does not appear anxious or depressed.    The ASCVD Risk score Denman George DC Jr., et al., 2013) failed to calculate for the following reasons:    Cannot find a previous HDL lab    Cannot find a previous total cholesterol lab    Laboratory Studies/Data review:  Labs ordered today, pending.   No results found for: CHOLESTEROL, HDLCHOL, LDLCHOL, TRIG  No results found for: GLUCOSEFAST  No results found for: HA1C    ASSESSMENT/PLAN     Orders Placed This Encounter   . Colonoscopy-Screening (RUBY ONLY)   . LIPID PANEL   . BASIC METABOLIC PANEL, FASTING   . gabapentin (NEURONTIN) 300 mg Oral Capsule   . citalopram (CELEXA) 20 mg Oral Tablet   . pantoprazole (PROTONIX) 40 mg Oral Tablet, Delayed Release (E.C.)   . hydroCHLOROthiazide (MICROZIDE) 12.5 mg Oral Capsule   . mometasone-formoterol (DULERA) 100-5 mcg/dose Inhalation HFA Aerosol Inhaler   . CYTOPATHOLOGY, GYN +/- HIGH RISK HPV     (Z00.00) Annual physical exam  (primary encounter diagnosis)  Plan: LIPID PANEL, BASIC METABOLIC PANEL, FASTING  Recommended screening guidelines discussed with patient and shared decision making completed.   - Age appropriate screening  completed/ordered today.   -Patient counseled today about the following preventive/treatment guideline for her age as above: diet,exercise, health body weights.   -Recommend a minimum of thirty minutes of moderate exercise (ie walking at a brisk pace, bicycling, etc.) 4-5 days per week to prevent weight gain from age-related loss of metabolism.  - vaccination records requested from previous PCP.     (Z12.4) Screening for cervical cancer  Plan: CYTOPATHOLOGY, GYN +/- HIGH RISK HPV    (Z12.11) Screening for colon cancer  Plan: Colonoscopy-Screening (RUBY ONLY)        (L12.0) Bullous pemphigoid  Plan:  Chronic, stable.  Continue following with dermatology for management medications.    (M54.16) Chronic left-sided lumbar radiculopathy  Chronic, stable.  Plan:  Continue gabapentin (NEURONTIN) 300 mg Oral Capsule  Controlled substance agreement up-to-date.  Follow-up 6 months  [facility]Care was last reviewed on 03/02/2021 11:30 AM by Solon Palm and result was REVIEWED PDMP.     Based on this, I have no concerns for prescribing controlled substances at this time.      (F39) Mood disorder (CMS HCC)  Chronic, mood stable.  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  Secondary contrast medication.  Plan:  Continue hydroCHLOROthiazide (MICROZIDE) 12.5 mg Oral         Capsule  Follow-up 6 months    Health Maintenance: Pending and Last Completed       Date Due Completion Date    Pneumococcal Vaccine, Age 82-64 (1 - PCV) Never done ---    Shingles Vaccine (1 of 2)  Never done ---    Pap smear Never done ---    Adult Tdap-Td (1 - Tdap) 05/27/2006 05/27/1996    Colonoscopy Never done ---    Mammography Never done ---    Covid-19 Vaccine (3 - Moderna risk series) 12/03/2019 11/05/2019    Influenza Vaccine (1) 01/25/2021 04/08/2013    HIV Screening 03/11/2022 (Originally 09/04/1983) ---    Depression Screening 03/02/2022 03/02/2021        Immunization History   Administered Date(s) Administered   . Covid-19 Vaccine,Moderna,12 Years+ 10/08/2019, 11/05/2019   . Diptheria & Tetanus Toxoid,Adsorbed, (Decavac/Tenivac) 75YRS & OLDER (Admin) 05/27/1996   . Influenza Vaccine, 6 month-adult 02/25/2011, 02/03/2012, 04/08/2013       Return in about 6 months (around 08/31/2021) for BP check, Medication Refill.    Antoine Poche, APRN,FNP-BC  03/02/2021, 07:38    Antoine Poche, APRN,FNP-BC  FAMILY MEDICINE, [facility]  Operated by [facility]  [address]  [place] [address]  Dept: [phone]  Dept Fax: [phone]    Patient was seen independently.    This document was generated using a voice recognition system and transcription. All documents are proofed as best as possible, but it may have misspelled words, incorrect words, or syntax and grammatical errors because of the imperfect nature of the system.

## 2021-03-06 ENCOUNTER — Other Ambulatory Visit (HOSPITAL_BASED_OUTPATIENT_CLINIC_OR_DEPARTMENT_OTHER): Payer: Self-pay | Admitting: Registered Nurse

## 2021-03-06 DIAGNOSIS — J302 Other seasonal allergic rhinitis: Secondary | ICD-10-CM

## 2021-03-06 NOTE — Telephone Encounter (Addendum)
Dulera not covered by insurance.   Can alternative be sent in?  Please advise. Thanks,  Willadean Carol, RN  03/06/2021, 16:26    Regarding: tennant pt // prescription  ----- Message from Antonietta Barcelona Delaurier sent at 03/06/2021  4:03 PM EDT -----  Antoine Poche, APRN,FNP-BC    Pharmacy called in requesting a refill for the following Rx:     mometasone-formoterol (DULERA) 100-5 mcg/dose Inhalation HFA Aerosol Inhaler, Take 2 Puffs by inhalation Twice daily Indications: controller medication for asthma    Pharmacy calling in stating this isn't covered by insurance     Preferred Pharmacy     [facility] [address] [place] - [address]    [address] [facility] [place] [address]    Phone: [phone] Fax: [phone]    Hours: Not open 24 hours        Thanks,  Aundra Millet A Delaurier

## 2021-03-08 LAB — CYTOPATHOLOGY, GYN +/- HIGH RISK HPV

## 2021-03-08 MED ORDER — DULERA 100 MCG-5 MCG/ACTUATION HFA AEROSOL INHALER
2.0000 | INHALATION_SPRAY | Freq: Two times a day (BID) | RESPIRATORY_TRACT | 2 refills | Status: DC
Start: 2021-03-08 — End: 2021-03-08

## 2021-03-08 MED ORDER — BUDESONIDE-FORMOTEROL HFA 160 MCG-4.5 MCG/ACTUATION AEROSOL INHALER
2.0000 | INHALATION_SPRAY | Freq: Two times a day (BID) | RESPIRATORY_TRACT | 5 refills | Status: DC
Start: 2021-03-08 — End: 2022-03-22

## 2021-03-08 NOTE — Telephone Encounter (Signed)
Asked to find covered alternative to [facility].    It is unclear which [employer] formulary patient is covered under.  Symbicort is a covered medication on many of them.  Recommend trying to switch to Symbicort 160.  Rx pended for Symbicort 160/4.5 2 puffs bid for provider consideration.    Jasper Riling, PharmD, [place], CTTS  Clinical Pharmacist  [facility] - Family Medicine     [place], [facility]  03/08/2021, 12:30

## 2021-03-09 LAB — HUMAN PAPILLOMA VIRUS (HPV) BY PCR WITH HIGH RISK GENOTYPING (THINPREP)
HPV OTHER: NEGATIVE
HPV16 PCR: NEGATIVE
HPV18 PCR: NEGATIVE

## 2021-03-12 NOTE — Result Encounter Note (Signed)
HPV was negative which is a normal finding. Recommended to continue routine screenings for cervical cancer with repeat exam in 5 years. Please keep your scheduled follow up visit. If you are have any further issues or need to speak sooner, please call [phone] so we can assist you.    Thank you for allowing us to take care of you.

## 2021-03-30 ENCOUNTER — Ambulatory Visit (INDEPENDENT_AMBULATORY_CARE_PROVIDER_SITE_OTHER): Payer: Managed Care, Other (non HMO) | Admitting: Registered"

## 2021-04-06 ENCOUNTER — Ambulatory Visit (INDEPENDENT_AMBULATORY_CARE_PROVIDER_SITE_OTHER): Payer: Managed Care, Other (non HMO) | Admitting: Internal Medicine

## 2021-04-10 ENCOUNTER — Encounter (HOSPITAL_BASED_OUTPATIENT_CLINIC_OR_DEPARTMENT_OTHER): Payer: Self-pay | Admitting: Registered Nurse

## 2021-04-10 DIAGNOSIS — Z6841 Body Mass Index (BMI) 40.0 and over, adult: Secondary | ICD-10-CM

## 2021-04-11 MED ORDER — CONTRAVE 8 MG-90 MG TABLET,EXTENDED RELEASE
2.0000 | ORAL_TABLET | Freq: Two times a day (BID) | ORAL | 3 refills | Status: DC
Start: 2021-04-11 — End: 2021-09-17

## 2021-04-11 NOTE — Telephone Encounter (Addendum)
Refill Request Note: [facility]    Patient requests refill of Contrave.    Last visit at [facility]:  03/02/2021 Annual  Last My Telemed/ My Chart Visit: Visit date not found     Next pending visit [facility]: 08/31/2021  Was last visit within the past year: yes  If not seen within the last year was appointment scheduled: yes    [facility] [place]    Medication pended to provider for approval.    Willadean Carol, RN 04/11/2021, 08:11    From: [name]  To: Antoine Poche, APRN,FNP-BC  Sent: 04/10/2021  3:29 PM EST  Subject: Medication    Hi,  on my last visit I thought Contrave 8-90 mg Tablet Sustained Release was going to be renewed for now. I didn't see it called in so was checking.  Thanks  [employer]

## 2021-04-20 ENCOUNTER — Other Ambulatory Visit (HOSPITAL_BASED_OUTPATIENT_CLINIC_OR_DEPARTMENT_OTHER): Payer: Self-pay | Admitting: Registered Nurse

## 2021-04-20 DIAGNOSIS — I158 Other secondary hypertension: Secondary | ICD-10-CM

## 2021-04-20 DIAGNOSIS — K219 Gastro-esophageal reflux disease without esophagitis: Secondary | ICD-10-CM

## 2021-05-24 ENCOUNTER — Other Ambulatory Visit (HOSPITAL_BASED_OUTPATIENT_CLINIC_OR_DEPARTMENT_OTHER): Payer: Self-pay | Admitting: Registered Nurse

## 2021-05-24 DIAGNOSIS — I158 Other secondary hypertension: Secondary | ICD-10-CM

## 2021-05-24 DIAGNOSIS — K219 Gastro-esophageal reflux disease without esophagitis: Secondary | ICD-10-CM

## 2021-05-24 DIAGNOSIS — Z6841 Body Mass Index (BMI) 40.0 and over, adult: Secondary | ICD-10-CM

## 2021-05-24 DIAGNOSIS — F39 Unspecified mood [affective] disorder: Secondary | ICD-10-CM

## 2021-05-24 DIAGNOSIS — M5416 Radiculopathy, lumbar region: Secondary | ICD-10-CM

## 2021-05-24 NOTE — Telephone Encounter (Addendum)
[facility] was last reviewed on 05/25/2021  7:50 AM by Solon Palm and result was REVIEWED PDMP.     Based on this, I have no concerns for prescribing controlled substances at this time.    Sherry Poche, APRN,FNP-BC  05/25/2021, 07:50        Refill Request Note: [facility]     Patient requests refill of Gabapentin.    Last visit at [facility]:  03/02/2021  Last My Telemed/ My Chart Visit: Visit date not found     Next pending visit [facility]: 08/31/2021  Was last visit within the past year: yes  If not seen within the last year was appointment scheduled:     [facility]    Medication pended to provider for approval.    Maricela Bo, RN 05/24/2021, 10:50                [facility] Controlled Substance Full Name Report Report Date 05/24/2021   From 05/24/2020 To 05/23/2021 Date of Birth August 03, 1970 Prescription Count 2   Last Name Hudspeth First Name Jadasia Middle Name                   Patients included in report that appear to match the search criteria.   Last Name First Name Middle Name Gender Address   [facility]  F [address] [place] , [place], [place], [address]         Prescriber Name Prescriber DEA & Zip Dispenser Name Dispenser DEA & Zip Rx Written Date Rx Dispense Date  & Date Sold   Rx Number Product Name MEDD Status Strength Qty Days # of Refill [facility] Payment Type   [facility] [address] [place], [address]   Sherry Matthews [id number] [phone] [facility], [place]. [id number] [phone] 03/02/2021 04/07/2021 04/12/2021 [id number] Gabapentin INACTIVE 300 mg/1 90 30 1/2 CV Insurance   Sherry Matthews [id number] [facility], [place]. [id number] [phone] 03/02/2021 03/02/2021 03/11/2021 [id number] Gabapentin INACTIVE 300 mg/1 90 30 0/2 CV Insurance

## 2021-05-25 MED ORDER — GABAPENTIN 300 MG CAPSULE
300.0000 mg | ORAL_CAPSULE | Freq: Three times a day (TID) | ORAL | 2 refills | Status: DC
Start: 2021-05-25 — End: 2021-08-31

## 2021-08-23 ENCOUNTER — Ambulatory Visit (HOSPITAL_BASED_OUTPATIENT_CLINIC_OR_DEPARTMENT_OTHER): Payer: Self-pay | Admitting: Registered Nurse

## 2021-08-23 NOTE — Telephone Encounter (Signed)
Regarding: Sherry Matthews Pt // Prior [name]  ----- Message from Leanna Sato [place] sent at 08/23/2021 11:28 AM EDT -----  Antoine Poche, APRN,FNP-BC    Pharmacy called in requesting a prior auth for the following Rx:           naltrexone-bupropion (CONTRAVE) 8-90 mg Oral Tablet Sustained Release, Take 2 Tablets by mouth Twice daily Indications: weight loss management for an obese person         Preferred Pharmacy     [facility] [address] [place] - [address]    [address] [facility] [place] [address]    Phone: [phone] Fax: [phone]    Hours: Not open 24 hours        Thanks,  [employer]

## 2021-08-23 NOTE — Telephone Encounter (Signed)
Prior authorization initiated on Contrave via covermymeds. Will update once determination is received.   Lenise Herald, LPN  2/63/7858, 13:17

## 2021-08-31 ENCOUNTER — Ambulatory Visit: Payer: Managed Care, Other (non HMO) | Attending: Registered Nurse | Admitting: Registered Nurse

## 2021-08-31 ENCOUNTER — Other Ambulatory Visit (HOSPITAL_BASED_OUTPATIENT_CLINIC_OR_DEPARTMENT_OTHER): Payer: Managed Care, Other (non HMO) | Admitting: Gynecology

## 2021-08-31 ENCOUNTER — Encounter (HOSPITAL_BASED_OUTPATIENT_CLINIC_OR_DEPARTMENT_OTHER): Payer: Self-pay | Admitting: Registered Nurse

## 2021-08-31 ENCOUNTER — Other Ambulatory Visit: Payer: Self-pay

## 2021-08-31 VITALS — BP 128/72 | HR 99 | Temp 97.4°F | Ht 62.0 in | Wt 264.1 lb

## 2021-08-31 DIAGNOSIS — F39 Unspecified mood [affective] disorder: Secondary | ICD-10-CM | POA: Insufficient documentation

## 2021-08-31 DIAGNOSIS — L120 Bullous pemphigoid: Secondary | ICD-10-CM | POA: Insufficient documentation

## 2021-08-31 DIAGNOSIS — Z0000 Encounter for general adult medical examination without abnormal findings: Secondary | ICD-10-CM | POA: Insufficient documentation

## 2021-08-31 DIAGNOSIS — M5416 Radiculopathy, lumbar region: Secondary | ICD-10-CM | POA: Insufficient documentation

## 2021-08-31 DIAGNOSIS — Z6841 Body Mass Index (BMI) 40.0 and over, adult: Secondary | ICD-10-CM | POA: Insufficient documentation

## 2021-08-31 LAB — LIPID PANEL
CHOL/HDL RATIO: 4.5
CHOLESTEROL: 181 mg/dL (ref 100–200)
HDL CHOL: 40 mg/dL — ABNORMAL LOW (ref >=50)
LDL CALC: 120 mg/dL — ABNORMAL HIGH (ref <100)
NON-HDL: 141 mg/dL (ref <=190)
TRIGLYCERIDES: 116 mg/dL (ref <150)
VLDL CALC: 20 mg/dL (ref <30)

## 2021-08-31 LAB — BASIC METABOLIC PANEL, FASTING
ANION GAP: 13 mmol/L (ref 4–13)
BUN/CREA RATIO: 18 (ref 6–22)
BUN: 18 mg/dL (ref 8–25)
CALCIUM: 9.5 mg/dL (ref 8.5–10.0)
CHLORIDE: 101 mmol/L (ref 96–111)
CO2 TOTAL: 23 mmol/L (ref 22–30)
CREATININE: 1.02 mg/dL (ref 0.60–1.05)
ESTIMATED GFR: 66 mL/min/BSA (ref >=60)
GLUCOSE: 104 mg/dL — ABNORMAL HIGH (ref 70–99)
POTASSIUM: 3.7 mmol/L (ref 3.5–5.1)
SODIUM: 137 mmol/L (ref 136–145)

## 2021-08-31 MED ORDER — GABAPENTIN 300 MG CAPSULE
300.0000 mg | ORAL_CAPSULE | Freq: Every evening | ORAL | 1 refills | Status: DC
Start: 2021-08-31 — End: 2022-03-26

## 2021-08-31 NOTE — Progress Notes (Signed)
[facility]: Family Medicine Office Visit      Service date: 08/31/2021:     MRN: [id number]   Birth date: 01/17/1971  PCP: Barrie Dunker, APRN,FNP-BC    Sherry Matthews is a 51 y.o. female who presents to [facility] today with chief complaint of Blood Pressure Check    HPI   Presents for medication check.    1. Mood disorder. Currently on Celexa for past year. Was on Lexapro prior for 20+ years, stopped due to weight gain.  Mood a little down, low motivation. Feels like it's related to taking contrave at strange times. Denies s/e of medication.     2. Bullous Pemphigoid, established with [facility] for management. Medication niacinamide and minocycline.    3. Lumbar DDD with left leg radiculopathy. Taking gabapentin for symptoms control, takes at night as daytime makes her very tired. Denies s/e of medication or misuse of medication.     4. OSA wears CPAP nightly.     5. Established with medical weight clinic. On contrave. On HCTZ for BP management due to contrave. Interested in alternative medication but hasn't followed up with weight clinic yet.     See ROS for further details.  ASSESSMENT/PLAN       ICD-10-CM    1. Chronic left-sided lumbar radiculopathy  M54.16 gabapentin (NEURONTIN) 300 mg Oral Capsule      2. Mood disorder (CMS HCC)  F39       3. BMI 45.0-49.9, adult (CMS HCC)  Z68.42       4. Bullous pemphigoid  L12.0         1. Chronic left-sided lumbar radiculopathy  Chronic, controlled. Continue medications   - gabapentin (NEURONTIN) 300 mg Oral Capsule; Take 1 Capsule (300 mg total) by mouth Every night  Dispense: 90 Capsule; Refill: 1  N[facility]xCare was last reviewed on 08/31/2021  7:45 AM by Clent Demark and result was REVIEWED PDMP.     Based on this, I have no concerns for prescribing controlled substances at this time.    2. Mood disorder (CMS HCC)  Chronic, stable  Continue Celexa.   Discussed adding Wellbutrin if she discontinues contrave.   Advised to call back  directly if there are further questions, or if these symptoms fail to improve as anticipated or worsen. Advised to seek urgent treatment if suicidal thoughts occur.  F/u 6 months    3. BMI 45.0-49.9, adult (CMS HCC)  Chronic, uncontrolled.   Continue contrave.  Recommend f/u with weight clinic, no appt at this time.   Discussed if stops contrave will discuss d/c hydrochlorothiazide and following BP.     4. Bullous pemphigoid  Chronic, stable. Continue following with dermatology.       Return in about 6 months (around 03/02/2022) for Wellness.    ROS   Review of Systems:  Review of Systems  Pertinent discussed in HPI, otherwise all other systems negative.   MEDICAL HISTORY   Medical History:  Problem list, PMHx, PSHx, FHx, SHx, allergies, and current Rx were reviewed and updated as appropriate.  Past Medical History:   Diagnosis Date   . Allergic rhinitis    . BMI 45.0-49.9, adult (CMS HCC)    . Bullous pemphigoid    . Chronic left-sided lumbar radiculopathy 11/30/2020   . Esophageal reflux    . Hypertension     due to Contrave   . Mood disorder (CMS HCC)    . OSA on CPAP 11/30/2020  Outpatient Medications Marked as Taking for the 08/31/21 encounter (Office Visit) with Barrie Dunker, APRN,FNP-BC   Medication Sig   . budesonide-formoteroL (SYMBICORT) 160-4.5 mcg/actuation Inhalation oral inhaler Take 2 Puffs by inhalation Twice daily   . citalopram (CELEXA) 20 mg Oral Tablet Take 1 Tablet (20 mg total) by mouth Once a day   . gabapentin (NEURONTIN) 300 mg Oral Capsule Take 1 Capsule (300 mg total) by mouth Every night   . hydroCHLOROthiazide (MICROZIDE) 12.5 mg Oral Capsule Take 1 Capsule (12.5 mg total) by mouth Once a day   . naltrexone-bupropion (CONTRAVE) 8-90 mg Oral Tablet Sustained Release Take 2 Tablets by mouth Twice daily Indications: weight loss management for an obese person   . Niacinamide (NICOMIDE-T) 500 mg Oral Tablet Take 1 Tablet (500 mg total) by mouth Once a day   . pantoprazole (PROTONIX) 40 mg  Oral Tablet, Delayed Release (E.C.) Take 1 Tablet (40 mg total) by mouth Once a day     OBJECTIVE   OBJECTIVE    BP 128/72   Pulse 99   Temp 36.3 C (97.4 F) (Thermal Scan)   Ht 1.575 m (5\' 2" )   Wt 120 kg (264 lb 1.8 oz)   SpO2 98%   BMI 48.31 kg/m     No LMP recorded. (Menstrual status: Irregular cycle).Body mass index is 48.31 kg/m.  Weight/BMI:  Wt Readings from Last 5 Encounters:   08/31/21 120 kg (264 lb 1.8 oz)   03/02/21 118 kg (260 lb 12.9 oz)   02/16/21 118 kg (259 lb 4.2 oz)   11/30/20 117 kg (257 lb 8 oz)      Physical Exam  Vitals reviewed.   Constitutional:       General: She is not in acute distress.  Cardiovascular:      Rate and Rhythm: Normal rate and regular rhythm.      Heart sounds: Normal heart sounds. No murmur heard.  Pulmonary:      Effort: Pulmonary effort is normal. No respiratory distress.      Breath sounds: Normal breath sounds.   Neurological:      General: No focal deficit present.      Mental Status: She is alert and oriented to person, place, and time.   Psychiatric:         Speech: Speech normal.         Behavior: Behavior is cooperative.         Thought Content: Thought content normal.         Laboratory Studies/Data review:  labs pending collection.    Patient seen independently.   Barrie Dunker, APRN,FNP-BC  08/31/2021, 07:34  Barrie Dunker, APRN,FNP-BC  F[facility]ILY MEDICINE, [facility]  Operated by [facility]  [address]  [place] [address]  Dept: [phone]  Dept Fax: [phone]    This document was generated using a voice recognition system and transcription. All documents are proofed as best as possible, but it may have misspelled words, incorrect words, or syntax and grammatical errors because of the imperfect nature of the system.

## 2021-08-31 NOTE — Result Encounter Note (Signed)
I have reviewed your labs and all were normal or within acceptable range. No changes in medications or further testing indicated at this time.     Please keep your scheduled follow up visit. If you are have any further issues or need to speak sooner, please call [phone] so we can assist you.    Thank you for allowing [place] to take care of you.    The 10-year ASCVD risk score (Arnett DK, et al., 2019) is: 1.9%    Values used to calculate the score:      Age: 51 years      Sex: Female      Is Non-Hispanic African American: No      Diabetic: No      Tobacco smoker: No      Systolic Blood Pressure: 128 mmHg      Is BP treated: No      HDL Cholesterol: 40 mg/dL      Total Cholesterol: 181 mg/dL

## 2021-09-17 ENCOUNTER — Other Ambulatory Visit (HOSPITAL_BASED_OUTPATIENT_CLINIC_OR_DEPARTMENT_OTHER): Payer: Self-pay | Admitting: Registered Nurse

## 2021-09-17 DIAGNOSIS — Z6841 Body Mass Index (BMI) 40.0 and over, adult: Secondary | ICD-10-CM

## 2021-09-17 NOTE — Telephone Encounter (Signed)
Chart opened in error as patient not due for refills of contrave.  Wesley Desanctis, RN  09/17/2021, 14:07

## 2021-10-02 MED ORDER — CONTRAVE 8 MG-90 MG TABLET,EXTENDED RELEASE
2.0000 | ORAL_TABLET | Freq: Two times a day (BID) | ORAL | 3 refills | Status: DC
Start: 2021-10-02 — End: 2022-04-15

## 2021-10-02 NOTE — Telephone Encounter (Addendum)
Spoke with patient. She started the medication in January 2019. Weight at that time was 275lb. Current weight is 260lb. Patient has lost 5.45% of body weight.  Jac Canavan, LPN  [id number], [id number]

## 2021-10-11 ENCOUNTER — Other Ambulatory Visit (HOSPITAL_COMMUNITY): Payer: Self-pay

## 2021-10-11 ENCOUNTER — Encounter (HOSPITAL_BASED_OUTPATIENT_CLINIC_OR_DEPARTMENT_OTHER): Payer: Self-pay

## 2021-10-11 ENCOUNTER — Inpatient Hospital Stay
Admission: RE | Admit: 2021-10-11 | Discharge: 2021-10-11 | Disposition: A | Discharge status: Home / Self Care | Payer: Managed Care, Other (non HMO) | Source: Ambulatory Visit | Attending: Registered Nurse | Admitting: Registered Nurse

## 2021-10-11 DIAGNOSIS — Z1239 Encounter for other screening for malignant neoplasm of breast: Secondary | ICD-10-CM

## 2021-10-11 DIAGNOSIS — Z1231 Encounter for screening mammogram for malignant neoplasm of breast: Secondary | ICD-10-CM | POA: Insufficient documentation

## 2021-10-11 NOTE — Result Encounter Note (Signed)
Just wanted to let you know that there were no suspicious areas noted on your mammogram. Thank you for completing this screening exam. It is recommended that you have a repeat mammogram in one year to continue surveillance for breast cancer. Please let me know if you have questions! Ilijah Doucet, APRN,FNP-BC

## 2021-10-30 ENCOUNTER — Encounter (HOSPITAL_BASED_OUTPATIENT_CLINIC_OR_DEPARTMENT_OTHER): Payer: Self-pay | Admitting: Registered Nurse

## 2021-10-30 DIAGNOSIS — F39 Unspecified mood [affective] disorder: Secondary | ICD-10-CM

## 2021-11-01 MED ORDER — BUPROPION HCL SR 150 MG TABLET,12 HR SUSTAINED-RELEASE
150.0000 mg | ORAL_TABLET | Freq: Two times a day (BID) | ORAL | 3 refills | Status: DC
Start: 2021-11-01 — End: 2022-07-02

## 2021-11-01 NOTE — Telephone Encounter (Addendum)
BuPROPion sent to pharmacy due to no longer on contrave currently.   Sherry Poche, APRN,FNP-BC      From: [name]  To: Sherry Poche, APRN,FNP-BC  Sent: 10/30/2021  2:12 PM EDT  Subject: Hughes Better,  I've called the pharmacy and ins and they both say Contrave isn't approved. I have a weight management appt at the end of the month, but being without the anti-depressant that was in the Contrave is a problem. I've been without the full dose of anti-depressant for about 6 weeks now. Moodiness (crying and easily irritated have increases), always tired, eating more, and depressed are all getting to be more and more. Can we do something about the anti-depressant medication.   Please  Thanks  [name]

## 2021-11-06 NOTE — Telephone Encounter (Signed)
Received approval by insurance on Contrave. Approval scanned into media.  Patient and pharmacy made aware.    Appeal for Contrave was approved. Patient made aware. Approval is through 11/06/2024.  Lenise Herald, LPN  2/62/0355, 15:57

## 2021-11-14 NOTE — Progress Notes (Signed)
Medical Weight Management at [place]  Return Patient Visit    Subjective:      Sherry Matthews is a 51 y.o. female here for follow-up to discuss weight loss.  Patient was a new patient on 02/16/2021 when she saw Dr.Rickard.  That time her starting weight was 259 lb with a BMI 48.64.  This placed her in the obesity class 3 category.  She states she was average weight is a child but she had always closely monitor her foods.  She got married at the age of 48 starting gaining weight immediately.  She is an Geographical information systems officer.  She deals with depression which also perpetuate her eating.  She has tried weight watchers Atkins and Contrave in the past but has not been successful.  She presents to our clinic for help with medical weight management.  He does have underlying sleep apnea test esophageal reflux and depression that is treated.     Is At her 1st visit she was counseled on the 4 pillars of medical weight management she was referred to our registered dietitians and is here today for follow-up visit.  She was going to reach out to her insurance company to see if they would cover any G LP 1 agonist.    TELEMEDICINE DOCUMENTATION:    Patient Location:  {[facility] TELEMED SITE (PATIENT LOCATION):210141016}    Patient/family aware of provider location:  {YES NO:23040}  Patient/family consent for telemedicine:  {YES NO:23040}  Examination observed and performed by:  {[facility] TELEMED PROVIDERS:[id number]}          Weight at start of treatment plan:   02/16/2021: Body mass index is 48.64 kg/m.  Weight: 118 kg (259 lb 4.2 oz) (waist 49.5in : neck 14.5in)    Weight/ Waist Circumference Last Visit:  02/16/2021: Body mass index is 48.64 kg/m.  Weight: 118 kg (259 lb 4.2 oz) (waist 49.5in : neck 14.5in)    Weight/ Waist Circumference/BMI today ***  11/14/2021: There is no height or weight on file to calculate BMI.        Percent weight loss from start of treatment  plan: ***           Past Weight Management Medications:  Contrave, it was stopped due to htn    Current Weight Management Medications/Comments/Concerns of current medication:  none      Contraindications to Weight Management Medications:  Personal history of medullary thyroid carcinoma ( No GLP-1)no  Personal history of Kidney Stones (No Topamax)no  History of Seizure (no Wellbutrin)no  Pregnancy Prevention (watch all meds)Not trying to conceive AND adequate pregnancy prevention  Pancreatitis (No GLP-1)no  CKD:no  CAD:no  HTN:yes  Allergies: see chart      Changes in Medical history/Medications since last visit:  Reviewed        Medications:  budesonide-formoteroL (SYMBICORT) 160-4.5 mcg/actuation Inhalation oral inhaler, Take 2 Puffs by inhalation Twice daily  buPROPion (WELLBUTRIN SR) 150 mg Oral tablet sustained-release 12 hr, Take 1 Tablet (150 mg total) by mouth Twice daily  cholecalciferol, vitamin D3, 50 mcg (2,000 unit) Oral Tablet, Take 2,000 Units by mouth Once a day (Patient not taking: Reported on 08/31/2021)  citalopram (CELEXA) 20 mg Oral Tablet, Take 1 Tablet (20 mg total) by mouth Once a day  gabapentin (NEURONTIN) 300 mg Oral Capsule, Take 1 Capsule (300 mg total) by mouth Every night  hydroCHLOROthiazide (MICROZIDE) 12.5 mg Oral Capsule, Take 1 Capsule (12.5 mg total) by mouth Once a day  minocycline (MINOCIN) 100 mg  Oral Capsule, Take 1 Capsule (100 mg total) by mouth Twice daily  naltrexone-bupropion (CONTRAVE) 8-90 mg Oral Tablet Sustained Release, Take 2 Tablets by mouth Twice daily Indications: weight loss management for an obese person  Niacinamide (NICOMIDE-T) 500 mg Oral Tablet, Take 1 Tablet (500 mg total) by mouth Once a day  pantoprazole (PROTONIX) 40 mg Oral Tablet, Delayed Release (E.C.), Take 1 Tablet (40 mg total) by mouth Once a day  silver sulfADIAZINE (SILVADENE) 1 % Cream, Apply topically Twice daily    No facility-administered medications prior to visit.              Objective:      There were no vitals taken for this visit.        There is no height or weight on file to calculate BMI.    Wt Readings from Last 6 Encounters:   08/31/21 120 kg (264 lb 1.8 oz)   03/02/21 118 kg (260 lb 12.9 oz)   02/16/21 118 kg (259 lb 4.2 oz)   11/30/20 117 kg (257 lb 8 oz)         Physical Exam:  HEENT: Normocephalic. No audible carotid bruits  HEART: Regular Rate and Rhythm. No murmurs rubs, gallops or clicks  LUNGS: Clear to ausculation bilaterally  ABDOMEN: Soft, non-tender  EXTREMITIES: Without pitting edema  NEURO: Alert and Oriented X 3. Appropriate affect   []  Limited telemed exam      Labs:    Lab Results   Component Value Date    CHOLESTEROL 181 08/31/2021    HDLCHOL 40 (L) 08/31/2021    LDLCHOL 120 (H) 08/31/2021    TRIG 116 08/31/2021          No results found for: HA1C      No results found for: GLUCOSEFAST       No results found for: TSH       No components found for: HEPATIC      Last BMP  (Last result in the past 2 years)      Na   K   Cl   CO2   BUN   Cr   Calcium   Glucose   Glucose-Fasting        08/31/21 0757 137   3.7   101   23   18   1.02   9.5   104                         Assessment/Plan:       51 y.o. female is here to discuss weight loss:        Has obesity complicated by: Hypertension, GERD, Depression/anxiety and Joint pain/back pain/osteoarthritis       Medical Considerations:   Labs needed:PCP follows and should continue   Meds associated with weight gain:none   Psych referrals/following:no- follows with PCP for depression trmt        Weight Loss Goal: Self-determined long-term goal: achieve a weight of <200 lbs              Realistic first goal (10% weight loss in 6 months): lose 25 lbs              Short-term goal:1-2 lbs/week               4 Pillars of Weight Management:      Food:     Hunger/cravings:***    24hr recall:    Breakfast:***  Lunch:***  Dinner:***  Snacks:***  Drinks: ***          Movement:    Physical activity is very important for overall health, but initial  weight loss comes mostly from changes in food.    Physical activity is most important for weight maintenance, not loss (loss requires very high amounts activity).      Walking is a good option that does not require special equipment or money, and can be tracked with a pedometer or phone app. Increase as tolerated, gradually, activity does not need to be done at one time.    Physical activity choice (important to be something you enjoy): ***        Behavior Changes:Goals and support:    Self-monitoring: Tracking food, movement, sleep, and weight has been associated with success, especially at the beginning of program. The intention is to eventually transition to healthy and natural interactions with food.     Logging of activity and foods:***    Stimulus control: A healthy food environment is important for success.     Sleep: Good sleep is important for weight loss, including a minimum of 6-7 hours of sleep daily.      Recovery from setbacks: Setbacks are inevitable--humans are not perfect. They key is handling them so they can be learning opportunities. Try to get back on track rather then letting them stop your progress.        Facebook support group: [email or web address]     Goals Addressed    None               Medical Considerations:    Current Weight Management Treatment:        No orders of the defined types were placed in this encounter.        No follow-ups on file.          Tynia Wiers PA-C  Medical Weight Management      This note was partially created using voice recognition software and is inherently subject to errors including those of syntax and "sound-alike" substitutions which may escape proof-reading.  In such instances, original meaning may be extrapolated by context.          [facility]: [email or web address]            Total independent time spent on date of service was *** minutes, which includes some or all of the  following: reviewing records, obtaining the history, counseling, goal-setting, placing orders, care coordination, and documenting.

## 2021-11-21 ENCOUNTER — Other Ambulatory Visit (INDEPENDENT_AMBULATORY_CARE_PROVIDER_SITE_OTHER): Payer: Self-pay | Admitting: Physician Assistant

## 2021-11-21 ENCOUNTER — Encounter (INDEPENDENT_AMBULATORY_CARE_PROVIDER_SITE_OTHER): Payer: Self-pay

## 2021-11-21 ENCOUNTER — Encounter (INDEPENDENT_AMBULATORY_CARE_PROVIDER_SITE_OTHER): Payer: Self-pay | Admitting: Physician Assistant

## 2021-11-21 ENCOUNTER — Ambulatory Visit: Payer: Managed Care, Other (non HMO) | Attending: Physician Assistant | Admitting: Physician Assistant

## 2021-11-21 VITALS — Ht 62.01 in | Wt 271.0 lb

## 2021-11-21 DIAGNOSIS — K219 Gastro-esophageal reflux disease without esophagitis: Secondary | ICD-10-CM | POA: Insufficient documentation

## 2021-11-21 DIAGNOSIS — I10 Essential (primary) hypertension: Secondary | ICD-10-CM

## 2021-11-21 DIAGNOSIS — M1990 Unspecified osteoarthritis, unspecified site: Secondary | ICD-10-CM

## 2021-11-21 DIAGNOSIS — Z9989 Dependence on other enabling machines and devices: Secondary | ICD-10-CM | POA: Insufficient documentation

## 2021-11-21 DIAGNOSIS — F32A Depression, unspecified: Secondary | ICD-10-CM

## 2021-11-21 DIAGNOSIS — Z713 Dietary counseling and surveillance: Secondary | ICD-10-CM

## 2021-11-21 DIAGNOSIS — F419 Anxiety disorder, unspecified: Secondary | ICD-10-CM

## 2021-11-21 DIAGNOSIS — Z6841 Body Mass Index (BMI) 40.0 and over, adult: Secondary | ICD-10-CM | POA: Insufficient documentation

## 2021-11-21 DIAGNOSIS — G4733 Obstructive sleep apnea (adult) (pediatric): Secondary | ICD-10-CM | POA: Insufficient documentation

## 2021-11-21 MED ORDER — LIRAGLUTIDE (WEIGHT LOSS) 3 MG/0.5 ML (18 MG/3 ML) SUBCUT PEN INJECTOR
PEN_INJECTOR | SUBCUTANEOUS | 0 refills | Status: DC
Start: 2021-11-21 — End: 2022-01-12

## 2021-11-21 MED ORDER — PEN NEEDLE, DIABETIC 32 GAUGE X 5/32"
3 refills | Status: DC
Start: 2021-11-21 — End: 2022-07-02

## 2021-11-21 NOTE — Nursing Note (Addendum)
Approved.  11/22/21-03/24/22  Patient notified  Approval scanned into epic.  Tiney Rouge, RN      Prior [employer] [facility]  CMM

## 2022-01-03 ENCOUNTER — Encounter (HOSPITAL_COMMUNITY): Payer: Self-pay

## 2022-01-04 ENCOUNTER — Inpatient Hospital Stay (HOSPITAL_COMMUNITY)
Admission: RE | Admit: 2022-01-04 | Discharge: 2022-01-04 | Disposition: A | Discharge status: Home / Self Care | Payer: Managed Care, Other (non HMO) | Source: Ambulatory Visit

## 2022-01-04 ENCOUNTER — Encounter (HOSPITAL_COMMUNITY): Payer: Self-pay

## 2022-01-04 ENCOUNTER — Encounter (INDEPENDENT_AMBULATORY_CARE_PROVIDER_SITE_OTHER): Payer: Self-pay

## 2022-01-04 HISTORY — DX: Other general symptoms and signs: R68.89

## 2022-01-04 HISTORY — DX: Unspecified asthma, uncomplicated: J45.909

## 2022-01-04 HISTORY — DX: Sleep apnea, unspecified: G47.30

## 2022-01-04 HISTORY — DX: Dependence on other enabling machines and devices: Z99.89

## 2022-01-12 ENCOUNTER — Other Ambulatory Visit (INDEPENDENT_AMBULATORY_CARE_PROVIDER_SITE_OTHER): Payer: Self-pay | Admitting: Physician Assistant

## 2022-01-14 MED ORDER — LIRAGLUTIDE (WEIGHT LOSS) 3 MG/0.5 ML (18 MG/3 ML) SUBCUT PEN INJECTOR
PEN_INJECTOR | SUBCUTANEOUS | 0 refills | Status: DC
Start: 2022-01-14 — End: 2022-01-20

## 2022-01-20 ENCOUNTER — Other Ambulatory Visit (INDEPENDENT_AMBULATORY_CARE_PROVIDER_SITE_OTHER): Payer: Self-pay | Admitting: Physician Assistant

## 2022-01-21 MED ORDER — LIRAGLUTIDE (WEIGHT LOSS) 3 MG/0.5 ML (18 MG/3 ML) SUBCUT PEN INJECTOR
PEN_INJECTOR | SUBCUTANEOUS | 0 refills | Status: DC
Start: 2022-01-21 — End: 2022-04-15

## 2022-01-24 ENCOUNTER — Inpatient Hospital Stay
Admission: RE | Admit: 2022-01-24 | Discharge: 2022-01-24 | Disposition: A | Discharge status: Home / Self Care | Payer: Managed Care, Other (non HMO) | Source: Ambulatory Visit | Attending: Gastroenterology | Admitting: Gastroenterology

## 2022-01-24 ENCOUNTER — Ambulatory Visit (HOSPITAL_COMMUNITY): Payer: Managed Care, Other (non HMO) | Admitting: Anesthesiology

## 2022-01-24 ENCOUNTER — Encounter (HOSPITAL_COMMUNITY)
Admission: RE | Disposition: A | Discharge status: Home / Self Care | Payer: Self-pay | Source: Ambulatory Visit | Attending: Gastroenterology

## 2022-01-24 ENCOUNTER — Encounter (HOSPITAL_COMMUNITY): Payer: Self-pay | Admitting: Gastroenterology

## 2022-01-24 ENCOUNTER — Other Ambulatory Visit: Payer: Self-pay

## 2022-01-24 ENCOUNTER — Ambulatory Visit (HOSPITAL_BASED_OUTPATIENT_CLINIC_OR_DEPARTMENT_OTHER): Payer: Managed Care, Other (non HMO) | Admitting: Anesthesiology

## 2022-01-24 DIAGNOSIS — Z1211 Encounter for screening for malignant neoplasm of colon: Secondary | ICD-10-CM | POA: Insufficient documentation

## 2022-01-24 DIAGNOSIS — R195 Other fecal abnormalities: Secondary | ICD-10-CM | POA: Insufficient documentation

## 2022-01-24 SURGERY — COLONOSCOPY
Anesthesia: Monitor Anesthesia Care | Site: Anus | Wound class: Clean Contaminated Wounds-The respiratory, GI, Genital, or urinary

## 2022-01-24 MED ORDER — PROPOFOL 10 MG/ML INTRAVENOUS EMULSION
INTRAVENOUS | Status: AC
Start: 2022-01-24 — End: 2022-01-24
  Filled 2022-01-24: qty 20

## 2022-01-24 MED ORDER — SODIUM CHLORIDE 0.9% FLUSH BAG - 250 ML
INTRAVENOUS | Status: DC | PRN
Start: 2022-01-24 — End: 2022-01-24

## 2022-01-24 MED ORDER — PROPOFOL 10 MG/ML IV BOLUS
INJECTION | Freq: Once | INTRAVENOUS | Status: DC | PRN
Start: 2022-01-24 — End: 2022-01-24
  Administered 2022-01-24: 50 mg via INTRAVENOUS
  Administered 2022-01-24 (×2): 30 mg via INTRAVENOUS
  Administered 2022-01-24: 20 mg via INTRAVENOUS

## 2022-01-24 MED ORDER — SODIUM CHLORIDE 0.9 % (FLUSH) INJECTION SYRINGE
2.0000 mL | INJECTION | INTRAMUSCULAR | Status: DC | PRN
Start: 2022-01-24 — End: 2022-01-24

## 2022-01-24 MED ORDER — PROPOFOL 10 MG/ML INTRAVENOUS EMULSION
INTRAVENOUS | Status: DC | PRN
Start: 2022-01-24 — End: 2022-01-24
  Administered 2022-01-24: 150 ug/kg/min via INTRAVENOUS
  Administered 2022-01-24: 200 ug/kg/min via INTRAVENOUS
  Administered 2022-01-24: 0 ug/kg/min via INTRAVENOUS
  Administered 2022-01-24: 300 ug/kg/min via INTRAVENOUS

## 2022-01-24 MED ORDER — DEXTROSE 5% IN WATER (D5W) FLUSH BAG - 250 ML
INTRAVENOUS | Status: DC | PRN
Start: 2022-01-24 — End: 2022-01-24

## 2022-01-24 MED ORDER — PROPOFOL 10 MG/ML INTRAVENOUS EMULSION
INTRAVENOUS | Status: AC
Start: 2022-01-24 — End: 2022-01-24
  Filled 2022-01-24: qty 50

## 2022-01-24 MED ORDER — LACTATED RINGERS INTRAVENOUS SOLUTION
INTRAVENOUS | Status: DC
Start: 2022-01-24 — End: 2022-01-24

## 2022-01-24 MED ORDER — FENTANYL (PF) 50 MCG/ML INJECTION SOLUTION
12.5000 ug | INTRAMUSCULAR | Status: DC | PRN
Start: 2022-01-24 — End: 2022-01-24

## 2022-01-24 MED ORDER — SODIUM CHLORIDE 0.9 % (FLUSH) INJECTION SYRINGE
2.0000 mL | INJECTION | Freq: Three times a day (TID) | INTRAMUSCULAR | Status: DC
Start: 2022-01-24 — End: 2022-01-24

## 2022-01-24 MED ORDER — LIDOCAINE (PF) 100 MG/5 ML (2 %) INTRAVENOUS SYRINGE
INJECTION | Freq: Once | INTRAVENOUS | Status: DC | PRN
Start: 2022-01-24 — End: 2022-01-24
  Administered 2022-01-24: 50 mg via INTRAVENOUS

## 2022-01-24 MED ORDER — FENTANYL (PF) 50 MCG/ML INJECTION SOLUTION
25.0000 ug | INTRAMUSCULAR | Status: DC | PRN
Start: 2022-01-24 — End: 2022-01-24

## 2022-01-24 SURGICAL SUPPLY — 8 items
FORCEPS ENDOS 240CM 2.8MM RJ 4 JMB DISP (ENDOSCOPIC SUPPLIES) IMPLANT
FORCEPS ENDOS 240CM 2.8MM RJ 4_JMB DISP (INSTRUMENTS ENDOMECHANICAL)
SNARE MED OVAL 240CM 2.4MM SNS LOOP SHRTHRW FLXB ENDOS 2.8MM WRK CHNL PLPCTM 27MM STRL DISP (ENDOSCOPIC SUPPLIES) IMPLANT
SNARE MED OVAL 240CM 2.4MM SNS_LOOP SHRTHRW FLXB ENDOS 2.8MM (INSTRUMENTS ENDOMECHANICAL)
SNARE SM OVAL 240CM 2.4MM SNS LOOP SHRTHRW FLXB ENDOS PLPCTM 13MM STRL LF  DISP (ENDOSCOPIC SUPPLIES) IMPLANT
SNARE SM OVL 240CM 2.4MM SNS L_OOP SHRTHRW FLXB ENDOS PLPCTM (INSTRUMENTS ENDOMECHANICAL)
TRAP SPEC RETR 15CM ETRAP PLPC_TM STRL DISP (MED SURG SUPPLIES)
TRAP SPECI REM 15CM ETRAP MAGNIFY WIND MSR GUIDE PLPCTM STRL LF  DISP (MED SURG SUPPLIES) IMPLANT

## 2022-01-24 NOTE — Anesthesia Preprocedure Evaluation (Addendum)
ANESTHESIA PRE-OP EVALUATION  Sherry Matthews  Planned Procedure: COLONOSCOPY (Anus)  Review of Systems     anesthesia history negative     patient summary reviewed  nursing notes reviewed        Pulmonary   asthma, sleep apnea and CPAP,   Cardiovascular    Hypertension , Exercise Tolerance: > or = 4 METS        GI/Hepatic/Renal    GERD        Endo/Other    Bullous pemphigoid and obesity,      Neuro/Psych/MS        Cancer                      Physical Assessment      Airway       Mallampati: IV    TM distance: >3 FB    Neck ROM: full  Mouth Opening: good.            Dental                 Comment: bridge       Pulmonary    Breath sounds clear to auscultation       Cardiovascular    Rhythm: regular  Rate: Normal       Other findings            Plan  ASA 2     Planned anesthesia type: MAC                       Intravenous induction     Anesthesia issues/risks discussed are: Nerve Injuries, Dental Injuries, Stroke, Cardiac Events/MI, Intraoperative Awareness/ Recall, Aspiration, Sore Throat and Difficult Airway.  Anesthetic plan and risks discussed with patient  signed consent obtained            Patient's NPO status is appropriate for Anesthesia.           Plan discussed with CRNA.    (R/b discussed. Plan MAC.)

## 2022-01-24 NOTE — H&P (Signed)
[facility]  GI Admission History and Physical      Sherry, Matthews   MRN:  [id number]  Date of Birth:  10-22-1970    Date of Procedure:  01/24/2022    Chief Complaint:  + cologuard    HPI: Sherry Matthews, Sherry Matthews is a 51 y.o. year old female who presents today for Colonoscopy.   This is the patient's 1st colonoscopy.    This procedure is being done to evaluate  + cologuard .    The patient denies family history of colon cancer.    Past Medical History:   Diagnosis Date    Allergic rhinitis     Asthma     uses inhalers when allergies are bad    BMI 45.0-49.9, adult (CMS HCC)     Bullous pemphigoid     Chronic left-sided lumbar radiculopathy 11/30/2020    CPAP (continuous positive airway pressure) dependence     presuure 10 per patient    Esophageal reflux     Hypertension     due to Contrave    Mood disorder (CMS HCC)     Neck problem     harder turning head to the left    OSA on CPAP 11/30/2020    Sleep apnea            Allergies   Allergen Reactions    Latex Rash    Carrot Rash    Eggs     Peanut     Shellfish Derived     Whey     [place] Hives/ Urticaria       Medications Prior to Admission       Prescriptions    budesonide-formoteroL (SYMBICORT) 160-4.5 mcg/actuation Inhalation oral inhaler    Take 2 Puffs by inhalation Twice daily    buPROPion (WELLBUTRIN SR) 150 mg Oral tablet sustained-release 12 hr    Take 1 Tablet (150 mg total) by mouth Twice daily    cholecalciferol, vitamin D3, 50 mcg (2,000 unit) Oral Tablet    Take 2,000 Units by mouth Once a day    Patient not taking:  Reported on 08/31/2021    citalopram (CELEXA) 20 mg Oral Tablet    Take 1 Tablet (20 mg total) by mouth Once a day    gabapentin (NEURONTIN) 300 mg Oral Capsule    Take 1 Capsule (300 mg total) by mouth Every night    hydroCHLOROthiazide (MICROZIDE) 12.5 mg Oral Capsule    Take 1 Capsule (12.5 mg total) by mouth Once a day    liraglutide, weight loss, 3 mg/0.5 mL (18 mg/3 mL) Subcutaneous Pen Injector    Take 0.6mg  daily x1 week, then  1.2mg  daily x1 week, then 1.8mg  daily x1 week, then 2.4mg  daily x1 week, then 3mg  daily    minocycline (MINOCIN) 100 mg Oral Capsule    Take 1 Capsule (100 mg total) by mouth Twice daily    naltrexone-bupropion (CONTRAVE) 8-90 mg Oral Tablet Sustained Release    Take 2 Tablets by mouth Twice daily Indications: weight loss management for an obese person    Patient not taking:  Reported on 01/24/2022    Niacinamide (NICOMIDE-T) 500 mg Oral Tablet    Take 1 Tablet (500 mg total) by mouth Once a day    pantoprazole (PROTONIX) 40 mg Oral Tablet, Delayed Release (E.C.)    Take 1 Tablet (40 mg total) by mouth Once a day    Pen Needle, Disposable, (BD UF NANO PEN NEEDLE) 32 gauge x  5/32" Needle    Use pen daily with liraglutide as directed    silver sulfADIAZINE (SILVADENE) 1 % Cream    Apply topically Twice daily             Past Surgical History:   Procedure Laterality Date    ADENOIDECTOMY      GASTROSCOPY      HX BUNIONECTOMY Right     HX CARPAL TUNNEL RELEASE Bilateral     HX CHOLECYSTECTOMY      LEG SURGERY Right 2017    tib fib HW, s/p fall off ladder           Physical Exam:  General:             No apparent distress  Eyes:                    Pupils equal and round  Mouth:                Mucous membranes moist  Neck:                   Supple, trachea midline  Lungs:                 Respirations are non labored, equal chest rising  Abdomen:         Soft, non tender, non distended  Extremities:     No cyanosis, no edema  Neuro:                Grossly normal  Skin:                    Warm and dry     Filed Vitals:    01/24/22 1202   BP: 123/87   Pulse: (!) 103   Resp: 18   Temp: 36.6 C (97.9 F)   SpO2: 99%         Assessment:   + cologuard    Plan:  Proceed with Colonoscopy. The patients bowel preparation consisted of  GoLytely.     Orders Placed This Encounter    OXYGEN - NASAL CANNULA    POCT URINE HCG    REMOVE SALINE LOCK    INSERT & MAINTAIN PERIPHERAL IV ACCESS    PERIPHERAL IV DRESSING CHANGE    INSERT &  MAINTAIN PERIPHERAL IV ACCESS    PERIPHERAL IV DRESSING CHANGE    NS flush syringe    NS flush syringe    NS 250 mL flush bag    D5W 250 mL flush bag    LR premix infusion    fentaNYL (SUBLIMAZE) 50 mcg/mL injection    fentaNYL (SUBLIMAZE) 50 mcg/mL injection    NS flush syringe    NS flush syringe    NS 250 mL flush bag    D5W 250 mL flush bag    LR premix infusion         Vivia Ewing, MD    =====================================================  01/24/2022    I have seen and examined the patient.  I reviewed the fellow's note.  I agree with the findings and plan of care as documented in the fellow's note.  Any exceptions/additions are edited/noted.    GI CONSENT  Consent: Risks, benefits, alternatives of  Colonoscopy were discussed with the patient in detail. Patient was made aware of 1 in a 1000 chance of bleeding, perforation, blood transfusions or repeated endoscopy with a diagnostic endoscopy. Therapeutic maneuvers such as EMR, dilation,  thermal therapy, polypectomy may add to these risks. Other risks include complications related to sedation or infection. Death is a rare event. All questions were answered and an opportunity provided to ask additional questions. Patient indicated their understanding of the above and have agreed to proceed.       Durward Fortes, MD,    01/24/2022, 13:09  Assistant Professor of Medicine  Gastroenterology and Hepatology  [place]

## 2022-01-24 NOTE — Discharge Instructions (Signed)
SURGICAL DISCHARGE INSTRUCTIONS     Dr. Durward Fortes, MD  performed your COLONOSCOPY today at the [facility]    [facility]:  Monday through Friday from 6 a.m. - 7 p.m.: [phone]  Between 7 p.m. - 6 a.m., weekends and holidays:  Call [facility] at [phone] or [phone].    PLEASE SEE WRITTEN HANDOUTS AS DISCUSSED BY YOUR NURSE:      SIGNS AND SYMPTOMS OF A WOUND / INCISION INFECTION   Be sure to watch for the following:  Increase in redness or red streaks near or around the wound or incision.  Increase in pain that is intense or severe and cannot be relieved by the pain medication that your doctor has given you.  Increase in swelling that cannot be relieved by elevation of a body part, or by applying ice, if permitted.  Increase in drainage, or if yellow / green in color and smells bad. This could be on a dressing or a cast.  Increase in fever for longer than 24 hours, or an increase that is higher than 101 degrees Fahrenheit (normal body temperature is 98 degrees Fahrenheit). The incision may feel warm to the touch.    **CALL YOUR DOCTOR IF ONE OR MORE OF THESE SIGNS / SYMPTOMS SHOULD OCCUR.    ANESTHESIA INFORMATION   ANESTHESIA -- ADULT PATIENTS:  You have received intravenous sedation / general anesthesia, and you may feel drowsy and light-headed for several hours. You may even experience some forgetfulness of the procedure. DO NOT DRIVE A MOTOR VEHICLE or perform any activity requiring complete alertness or coordination until you feel fully awake in about 24-48 hours. Do not drink alcoholic beverages for at least 24 hours. Do not stay alone, you must have a responsible adult available to be with you. You may also experience a dry mouth or nausea for 24 hours. This is a normal side effect and will disappear as the effects of the medication wear off.    REMEMBER   If you experience any difficulty breathing, chest pain, bleeding that you feel is excessive,  persistent nausea or vomiting or for any other concerns:  Call your physician Dr. Mickle Plumb at [phone] or [phone]. You may also ask to have the GI doctor on call paged. They are available to you 24 hours a day.    SPECIAL INSTRUCTIONS / COMMENTS   Instructions for Lower Endoscopy  Patient Information:  No driving until tomorrow, You may experience a small amount of blood in your stool if a biopsy has been taken , and Call in one week for biopsy results - Referring MD   REMEMBER TO:  Call your physician for any of the following symptoms:  1.) Persistent vomiting or vomiting bright red blood  2.) Fever (temperature greater than 100F)  3.) Severe abdominal pain or rigid bloated abdomen.  4.) Large amounts of bright red blood or maroon stools.      FOLLOW-UP APPOINTMENTS   Please call patient services at [phone] or [phone] to schedule a date / time of return. They are open Monday - Friday from 7:30 am - 5:00 pm.

## 2022-01-24 NOTE — Anesthesia Transfer of Care (Signed)
ANESTHESIA TRANSFER OF CARE   Sherry Matthews is a 51 y.o. ,female, Weight: 122 kg (269 lb 6.4 oz)   had Procedure(s):  COLONOSCOPY  performed  01/24/22   Primary Service: Tory Emerald*    Past Medical History:   Diagnosis Date    Allergic rhinitis     Asthma     uses inhalers when allergies are bad    BMI 45.0-49.9, adult (CMS HCC)     Bullous pemphigoid     Chronic left-sided lumbar radiculopathy 11/30/2020    CPAP (continuous positive airway pressure) dependence     presuure 10 per patient    Esophageal reflux     Hypertension     due to Contrave    Mood disorder (CMS HCC)     Neck problem     harder turning head to the left    OSA on CPAP 11/30/2020    Sleep apnea       Allergy History as of 01/24/22       LATEX         Noted Status Severity Type Reaction    11/30/20 0746 Darla Lesches., LPN 84/13/24 Active Medium  Rash              SHELLFISH DERIVED         Noted Status Severity Type Reaction    11/30/20 0746 Darla Lesches., LPN 40/10/27 Active                 WHEY         Noted Status Severity Type Reaction    11/30/20 0746 Darla Lesches., LPN 25/36/64 Active                 PEANUT         Noted Status Severity Type Reaction    11/30/20 0746 Darla Lesches., LPN 40/34/74 Active                 CARROT         Noted Status Severity Type Reaction    02/16/21 1019 Vira Blanco, Ambulatory Care Assistant 08/08/14 Active Medium  Rash              EGGS         Noted Status Severity Type Reaction    02/16/21 1020 Vira Blanco, Ambulatory Care Assistant 02/16/21 Active                 LIMA BEAN         Noted Status Severity Type Reaction    03/02/21 0731 Carol Ada D., LPN 25/95/63 Active Low  Hives/ Urticaria                  I completed my transfer of care / handoff to the receiving personnel during which we discussed:  All key/critical aspects of case discussed and Gave opportunity for questions and acknowledgement of understanding      Post Location: Phase II                         Additional Info:Report given to RN                                   Last OR Temp: Temperature: 36.1 C (97 F)  ABG:  POTASSIUM   Date Value Ref Range Status   08/31/2021 3.7 3.5 -  5.1 mmol/L Final     CALCIUM   Date Value Ref Range Status   08/31/2021 9.5 8.5 - 10.0 mg/dL Final     Airway:* No LDAs found *  Blood pressure 116/64, pulse 88, temperature 36.1 C (97 F), resp. rate 16, height 1.575 m (5\' 2" ), weight 122 kg (269 lb 6.4 oz), last menstrual period 08/27/2021, SpO2 100 %.

## 2022-01-24 NOTE — OR Nursing (Signed)
Procedure tolerated well

## 2022-01-24 NOTE — Anesthesia Postprocedure Evaluation (Signed)
Anesthesia Post Op Evaluation    Patient: Sherry Matthews  Procedure(s):  COLONOSCOPY    Last Vitals:Temperature: 36.1 C (97 F) (01/24/22 1423)  Heart Rate: 77 (01/24/22 1423)  BP (Non-Invasive): 111/84 (01/24/22 1425)  Respiratory Rate: 16 (01/24/22 1423)  SpO2: 98 % (01/24/22 1423)    No notable events documented.    Patient is sufficiently recovered from the effects of anesthesia to participate in the evaluation and has returned to their pre-procedure level.  Patient location during evaluation: PACU       Patient participation: complete - patient participated  Level of consciousness: awake    Pain management: adequate  Airway patency: patent    Anesthetic complications: no  Cardiovascular status: acceptable  Respiratory status: acceptable  Hydration status: acceptable  Patient post-procedure temperature: Pt Normothermic   PONV Status: Absent

## 2022-02-14 NOTE — Progress Notes (Deleted)
Medical Weight Management at [place]  Return Patient Visit    Subjective:      Sherry Matthews is a 51 y.o. female here for follow-up to discuss weight loss.  Patient was a new patient on 02/16/2021 when she saw Dr.Rickard.  That time her starting weight was 259 lb with a BMI 48.64.  This placed her in the obesity class 3 category.  She states she was average weight is a child but she had always closely monitor her foods.  She got married at the age of 40 starting gaining weight immediately.  She is an Geographical information systems officer.  She deals with depression which also perpetuates her eating.  She has tried weight watchers Atkins and Contrave in the past but has not been successful.  She presents to our clinic for help with medical weight management.  She does have underlying sleep apnea, esophageal reflux and depression that is treated.     Is At her 1st visit she was counseled on the 4 pillars of medical weight management she was referred to our registered dietitians and is here today for follow-up visit.  She was going to reach out to her insurance company to see if they would cover any G LP 1 agonist. They will not cover injectables. She met with Tye [place] and was given some dietary recommendations. She has not started to track her calories or macros yet. She says she has so many allergies she cannot eat most foods. She says she will start tracking her calories after this appt and will try to stay within her limit. She will also make a list of the foods she CAN eat and take them to [facility] for guidance.             TELEMEDICINE DOCUMENTATION:    Patient Location:  MyChart video visit from home address: [place] [address]    Patient/family aware of provider location:  yes  Patient/family consent for telemedicine:  yes  Examination observed and performed by:  Willette Cluster Maycel Riffe, PA-C          Weight at start of treatment plan:    02/16/2021: Body mass index is 48.64 kg/m.  Weight: 118 kg (259 lb 4.2 oz) (waist 49.5in : neck 14.5in)    Weight/ Waist Circumference Last Visit:   271lbs  11/21/2021: Body mass index is 49.55 kg/m.      Weight/ Waist Circumference/BMI today:***  02/14/2022: There is no height or weight on file to calculate BMI.        Percent weight loss from start of treatment plan: ***        Past Weight Management Medications:  None, PCP prescribed Contrave      Current Weight Management Medications/Comments/Concerns of current medication:  Contrave-prescribed by her PCP      Contraindications to Weight Management Medications:  Personal history of medullary thyroid carcinoma ( No GLP-1)no  Personal history of Kidney Stones (No Topamax)no  History of Seizure (no Wellbutrin)no  Pregnancy Prevention (watch all meds)Not trying to conceive AND adequate pregnancy prevention  Pancreatitis (No GLP-1)no  CKD:no  CAD:no  HTN:yes  Allergies: see chart      Changes in Medical history/Medications since last visit:  Reviewed        Medications:  budesonide-formoteroL (SYMBICORT) 160-4.5 mcg/actuation Inhalation oral inhaler, Take 2 Puffs by inhalation Twice daily  buPROPion (WELLBUTRIN SR) 150 mg Oral tablet sustained-release 12 hr, Take 1 Tablet (150 mg total) by mouth Twice daily  cholecalciferol, vitamin  D3, 50 mcg (2,000 unit) Oral Tablet, Take 2,000 Units by mouth Once a day (Patient not taking: Reported on 08/31/2021)  citalopram (CELEXA) 20 mg Oral Tablet, Take 1 Tablet (20 mg total) by mouth Once a day  gabapentin (NEURONTIN) 300 mg Oral Capsule, Take 1 Capsule (300 mg total) by mouth Every night  hydroCHLOROthiazide (MICROZIDE) 12.5 mg Oral Capsule, Take 1 Capsule (12.5 mg total) by mouth Once a day  liraglutide, weight loss, 3 mg/0.5 mL (18 mg/3 mL) Subcutaneous Pen Injector, Take 0.23m daily x1 week, then 1.222mdaily x1 week, then 1.84m39maily x1 week, then 2.4mg22mily x1 week, then 3mg 37mly  minocycline (MINOCIN) 100 mg Oral  Capsule, Take 1 Capsule (100 mg total) by mouth Twice daily  naltrexone-bupropion (CONTRAVE) 8-90 mg Oral Tablet Sustained Release, Take 2 Tablets by mouth Twice daily Indications: weight loss management for an obese person (Patient not taking: Reported on 01/24/2022)  Niacinamide (NICOMIDE-T) 500 mg Oral Tablet, Take 1 Tablet (500 mg total) by mouth Once a day  pantoprazole (PROTONIX) 40 mg Oral Tablet, Delayed Release (E.C.), Take 1 Tablet (40 mg total) by mouth Once a day  Pen Needle, Disposable, (BD UF NANO PEN NEEDLE) 32 gauge x 5/32" Needle, Use pen daily with liraglutide as directed  silver sulfADIAZINE (SILVADENE) 1 % Cream, Apply topically Twice daily    No facility-administered medications prior to visit.              Objective:     There were no vitals taken for this visit.        There is no height or weight on file to calculate BMI.    Wt Readings from Last 6 Encounters:   01/24/22 122 kg (269 lb 6.4 oz)   11/21/21 123 kg (271 lb)   08/31/21 120 kg (264 lb 1.8 oz)   03/02/21 118 kg (260 lb 12.9 oz)   02/16/21 118 kg (259 lb 4.2 oz)   11/30/20 117 kg (257 lb 8 oz)         Physical Exam:  NEURO: Alert and Oriented X 3. Appropriate affect   _0  Limited telemed exam      Labs:    Lab Results   Component Value Date    CHOLESTEROL 181 08/31/2021    HDLCHOL 40 (L) 08/31/2021    LDLCHOL 120 (H) 08/31/2021    TRIG 116 08/31/2021          No results found for: HA1C      No results found for: GLUCOSEFAST       No results found for: TSH       No components found for: HEPATIC      Last BMP  (Last result in the past 2 years)        Na   K   Cl   CO2   BUN   Cr   Calcium   Glucose   Glucose-Fasting        08/31/21 0757 137   3.7   101   23   18   1.02   9.5   104                           Assessment/Plan:       51 y.65 female is here to discuss weight loss:  Recommended she start tracking foods and make a list of foods high in protein she can eat. Has to stop the sugar in  the am and she says she will try    Has obesity  complicated by: Hypertension, GERD, Depression/anxiety and Joint pain/back pain/osteoarthritis       Medical Considerations:   Labs needed:PCP follows and should continue   Meds associated with weight gain:none   Psych referrals/following:no- follows with PCP for depression trmt        Weight Loss Goal: Self-determined long-term goal: achieve a weight of <200 lbs              Realistic first goal (10% weight loss in 6 months): lose 25 lbs              Short-term goal:1-2 lbs/week             4 Pillars of Weight Management:      Food:     Hunger/cravings: craves carbs    24hr recall:    Breakfast:cereal (frosted flakes with water)1 sweet tea  Lunch:sandwich lunch meat, cheese and white wheat plain with water  Dinner:none  Snacks:cereal, ice cream  Drinks: water and sweet tea      Estimate energy requirement per Mifflin-St. Jeor  Formula:  Female REE: 1734 kcals  Multiply REE X Activity Factor (1.2) = 2080 kcal/day           Minus 500 kcal to 750 kcal/day to promote weight loss: 1330-1580 Kcal     Protein needs: 78-98g (1.2-1.5g/Kg Adj bw)           Movement:    Physical activity is very important for overall health, but initial weight loss comes mostly from changes in food.    Physical activity is most important for weight maintenance, not loss (loss requires very high amounts activity).      Walking is a good option that does not require special equipment or money, and can be tracked with a pedometer or phone app. Increase as tolerated, gradually, activity does not need to be done at one time.    Physical activity choice (important to be something you enjoy): was walking 2-3 times a week and will try to get 5 days.       Behavior Changes:Goals and support:    Self-monitoring: Tracking food, movement, sleep, and weight has been associated with success, especially at the beginning of program. The intention is to eventually transition to healthy and natural interactions with food.     Logging of activity and foods:  lose it app    Stimulus control: A healthy food environment is important for success.     Sleep: Good sleep is important for weight loss, including a minimum of 6-7 hours of sleep daily.      Recovery from setbacks: Setbacks are inevitable--humans are not perfect. They key is handling them so they can be learning opportunities. Try to get back on track rather then letting them stop your progress.        Facebook support group: [email or web address]     Goals Addressed    None               Medical Considerations:    Current Weight Management Treatment:        No orders of the defined types were placed in this encounter.        No follow-ups on file.          Carrie Schoonmaker PA-C  Medical Weight Management      This note was partially created using voice recognition software and is inherently subject to errors  including those of syntax and "sound-alike" substitutions which may escape proof-reading.  In such instances, original meaning may be extrapolated by context.          [facility]: [email or web address]            Total independent time spent on date of service was *** minutes, which includes some or all of the following: reviewing records, obtaining the history, counseling, goal-setting, placing orders, care coordination, and documenting.

## 2022-02-21 ENCOUNTER — Ambulatory Visit (INDEPENDENT_AMBULATORY_CARE_PROVIDER_SITE_OTHER): Payer: Managed Care, Other (non HMO) | Admitting: Physician Assistant

## 2022-03-04 ENCOUNTER — Ambulatory Visit (HOSPITAL_BASED_OUTPATIENT_CLINIC_OR_DEPARTMENT_OTHER): Payer: Self-pay | Admitting: Registered Nurse

## 2022-03-11 ENCOUNTER — Ambulatory Visit (HOSPITAL_BASED_OUTPATIENT_CLINIC_OR_DEPARTMENT_OTHER): Payer: Managed Care, Other (non HMO) | Admitting: Registered Nurse

## 2022-03-20 ENCOUNTER — Other Ambulatory Visit (HOSPITAL_BASED_OUTPATIENT_CLINIC_OR_DEPARTMENT_OTHER): Payer: Self-pay | Admitting: Registered Nurse

## 2022-03-20 DIAGNOSIS — I158 Other secondary hypertension: Secondary | ICD-10-CM

## 2022-03-20 DIAGNOSIS — M5416 Radiculopathy, lumbar region: Secondary | ICD-10-CM

## 2022-03-22 ENCOUNTER — Other Ambulatory Visit (HOSPITAL_BASED_OUTPATIENT_CLINIC_OR_DEPARTMENT_OTHER): Payer: Self-pay | Admitting: Registered Nurse

## 2022-03-22 NOTE — Telephone Encounter (Signed)
Refill Request: [facility]    Pharmacy requests refill of Symbicort.    Last visit at [address]:  08/31/2021  2.   Last My Telemed/ My Chart Visit: Visit date not found     3.  Next pending visit CLP: 04/15/2022  4.  Was last visit within the past year: yes  5.  If not seen within the last year was appointment scheduled: n/a    6.  [facility]ed [facility]: [facility]    Medication pended to provider for approval.    Alfredo Martinez, Ambulatory Care Assistant 03/22/2022, 07:22

## 2022-04-05 ENCOUNTER — Other Ambulatory Visit (HOSPITAL_BASED_OUTPATIENT_CLINIC_OR_DEPARTMENT_OTHER): Payer: Self-pay | Admitting: Registered Nurse

## 2022-04-05 DIAGNOSIS — F39 Unspecified mood [affective] disorder: Secondary | ICD-10-CM

## 2022-04-05 NOTE — Telephone Encounter (Signed)
Refill Request: [facility]  Pharmacy requests refill of Celexa.    1.  Last visit at [place]:  08/31/2021  2.  Last My Telemed/ My Chart Visit: Visit date not found   3.  Next pending visit CLP: 04/15/2022  4.  Was last visit within the past year: yes  5.  If not seen within the last year was appointment scheduled: n/a  6.  [facility]    Medication pended to provider for approval.    Alfredo Martinez, Ambulatory Care Assistant 04/05/2022, 10:57

## 2022-04-12 ENCOUNTER — Other Ambulatory Visit (HOSPITAL_BASED_OUTPATIENT_CLINIC_OR_DEPARTMENT_OTHER): Payer: Self-pay | Admitting: Registered Nurse

## 2022-04-12 DIAGNOSIS — I158 Other secondary hypertension: Secondary | ICD-10-CM

## 2022-04-15 ENCOUNTER — Other Ambulatory Visit: Payer: Self-pay

## 2022-04-15 ENCOUNTER — Encounter (HOSPITAL_BASED_OUTPATIENT_CLINIC_OR_DEPARTMENT_OTHER): Payer: Self-pay | Admitting: Registered Nurse

## 2022-04-15 ENCOUNTER — Ambulatory Visit: Payer: Managed Care, Other (non HMO) | Attending: Registered Nurse | Admitting: Registered Nurse

## 2022-04-15 VITALS — BP 124/82 | HR 88 | Ht 62.0 in | Wt 275.1 lb

## 2022-04-15 DIAGNOSIS — R0981 Nasal congestion: Secondary | ICD-10-CM | POA: Insufficient documentation

## 2022-04-15 DIAGNOSIS — I158 Other secondary hypertension: Secondary | ICD-10-CM | POA: Insufficient documentation

## 2022-04-15 DIAGNOSIS — M950 Acquired deformity of nose: Secondary | ICD-10-CM | POA: Insufficient documentation

## 2022-04-15 DIAGNOSIS — F39 Unspecified mood [affective] disorder: Secondary | ICD-10-CM | POA: Insufficient documentation

## 2022-04-15 DIAGNOSIS — G4733 Obstructive sleep apnea (adult) (pediatric): Secondary | ICD-10-CM | POA: Insufficient documentation

## 2022-04-15 DIAGNOSIS — Z0000 Encounter for general adult medical examination without abnormal findings: Secondary | ICD-10-CM | POA: Insufficient documentation

## 2022-04-15 DIAGNOSIS — Z23 Encounter for immunization: Secondary | ICD-10-CM | POA: Insufficient documentation

## 2022-04-15 NOTE — Progress Notes (Signed)
Immunization administered       Name Date Dose VIS Date Route    Tetanus Toxoid/Diphtheria Toxoid/Acellular Pertussis Vaccine, Adsorbed (Tdap) 04/15/2022 0.5 mL 12/31/2019 Intramuscular    Site: Left arm    Given By: Christain Niznik, Ambulatory Care Assistant    Manufacturer: GlaxoSmithKline    Lot: [id number]    NDC: [id number]          Patient tolerated vaccine well, no questions or concerns at this time.    Antwanette Wesche, Ambulatory Care Assistant

## 2022-04-15 NOTE — Progress Notes (Signed)
[facility]: Family Medicine Office Visit  Annual Well Women Visit      Service date: 04/15/2022:   Birth date: 22-Dec-1970    Sherry Matthews is a 51 y.o. female who presents to [facility] today with chief complaint of Annual Wellness Exam   annual well exam.   Please see separate section for any listed chronic or acute medical diagnosis if indicated.   Health Care Maintenance / HPI     Having issues getting medication due to pharmacy issues. May switch pharmacy but will let me know later.   Hydrochlorothiazide started while on contrave due to elevated BP. No longer on contrave. Interested in stopping HCTZ and see how BP does.   CPAP died, needs new one. Hasn't established with DME company since moving from [facility]. CPAP previously managed by sleep clinic in [place].   Last sleep study in 2013.   Increased fatigue past few months.   Chronic nasal congestion, trouble breathing through nose. No past injury or trauma reported. Breaths through mouth often.  Hx chronic allergies and asthma.     Other chronic medical stable. 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     - PHQ-2 Depression screening  Over the past 2 weeks have you been bother by:  Little interested or pleasure in doing things? no  Feeling down depressed or hopeless? no   reports that she has never smoked. She has never used smokeless tobacco. She reports that she does not currently use alcohol. She reports that she does not use drugs.  -Alcohol Misuse: no  -Diet History: well balanced  -Exercise History:  no routine  -STI Screening-Any known exposure/risk to STI's:  no  -Routine Eye and Dentist Visits : advised to schedule    -Cervical cancer (every 3 yrs if negative age 20-31yrs PAP only or every 5 yrs age 28-65 with cotesting, >65 d/c if past 10 years negative): Up to date, not currently due Hx of abnormal pap smears: no.   -Annual / biannual Mammography: Up to date.   -Colorectal cancer: UTD see HM section.    Health  Maintenance: Pending and Last Completed         Date Due Completion Date    Shingles Vaccine (1 of 2) Never done ---    Influenza Vaccine (1) 05/15/2022 (Originally 01/25/2022) 04/08/2013    Covid-19 Vaccine (3 - 2023-24 season) 04/16/2023 (Originally 01/25/2022) 11/05/2019    Mammography 10/12/2022 10/11/2021    Depression Screening 04/16/2023 04/15/2022    Pap smear 03/02/2026 03/02/2021    Colonoscopy 01/25/2032 01/24/2022    Adult Tdap-Td (2 - Td or Tdap) 04/15/2032 04/15/2022          MEDICAL HISTORY     Review of Systems:  Constitutional: no fever, chills, or significant weight changes.  Eyes: negative for vision changes.  Ears, nose, mouth, throat, and face: negative for ear pain or sore throat.   Respiratory: negative for dyspnea.  Cardiovascular: negative for chest pain.  Gastrointestinal: negative for nausea, vomiting, bowel changes.  Genitourinary:negative for dysuria, hematuria.    Integument/breast: negative for rashes.  Musculoskeletal:negative for new myalgias or arthralgias.   Neurological: no muscle weakness.   Behvioral/Psych: negative for depression and anxiety.    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    CPAP (continuous positive airway pressure) dependence     presuure 10 per patient    Esophageal reflux     Hypertension     due to Contrave    Mood disorder (CMS HCC)     Neck problem     harder turning head to the left    OSA on CPAP 11/30/2020    Sleep apnea          Social History     Tobacco Use    Smoking status: Never    Smokeless tobacco: Never   Vaping Use    Vaping Use: Never used   Substance Use Topics    Alcohol use: Not Currently    Drug use: Never     Allergies   Allergen Reactions    Latex Rash    Carrot Rash    Eggs     Peanut     Shellfish Derived     Whey     [place] Hives/ Urticaria       Outpatient  Medications Marked as Taking for the 04/15/22 encounter (Office Visit) with Antoine Poche, APRN,FNP-BC   Medication Sig    buPROPion (WELLBUTRIN SR) 150 mg Oral tablet sustained-release 12 hr Take 1 Tablet (150 mg total) by mouth Twice daily    citalopram (CELEXA) 20 mg Oral Tablet TAKE 1 TABLET BY MOUTH ONCE A DAY    gabapentin (NEURONTIN) 300 mg Oral Capsule TAKE 1 CAPSULE BY MOUTH EVERY NIGHT.    hydroCHLOROthiazide (MICROZIDE) 12.5 mg Oral Capsule TAKE 1 CAPSULE (12.5 MG TOTAL) BY MOUTH ONCE A DAY    minocycline (MINOCIN) 100 mg Oral Capsule Take 1 Capsule (100 mg total) by mouth Twice daily    Niacinamide (NICOMIDE-T) 500 mg Oral Tablet Take 1 Tablet (500 mg total) by mouth Once a day    pantoprazole (PROTONIX) 40 mg Oral Tablet, Delayed Release (E.C.) Take 1 Tablet (40 mg total) by mouth Once a day    Pen Needle, Disposable, (BD UF NANO PEN NEEDLE) 32 gauge x 5/32" Needle Use pen daily with liraglutide as directed    SYMBICORT 160-4.5 mcg/actuation Inhalation oral inhaler INHALE 2 PUFFS BY MOUTH TWICE A DAY       The patient's medications were reviewed and updated at the end of the clinic visit today.  OBJECTIVE   BP 124/82   Pulse 88   Ht 1.575 m (5\' 2" )   Wt 125 kg (275 lb 2.2 oz)   SpO2 97%   BMI 50.32 kg/m     No LMP recorded. (Menstrual status: Irregular cycle).Body mass index is 50.32 kg/m.  Wt Readings from Last 3 Encounters:   04/15/22 125 kg (275 lb 2.2 oz)   01/24/22 122 kg (269 lb 6.4 oz)   11/21/21 123 kg (271 lb)     BP Readings from Last 3 Encounters:   04/15/22 124/82   01/24/22 111/84   08/31/21 128/72     Physical examination:  General: Well-developed and well-nourished female, well-appearing and in no acute distress.  Eyes: Pupils equal, round, and reactive to light and accommodation. No conjunctival injection or scleral icterus noted.  HENT: TMs appear normal. Mucous membranes moist. Oropharynx without erythema or exudate.   Neck: No lymphadenopathy or thyromegaly.  Lungs: Clear to  auscultation bilaterally.    Cardiovascular: Regular rate and rhythm. No murmurs, rubs, or gallops.  Pulses 2+ and symmetric.   Abdomen: Soft, Bowel sounds present in all 4  quadrants. No organomegaly noted.   Genitourinary: No suprapubic  tenderness.   Musculoskeletal: Well perfused and without deformity or edema. Full ROM. Strength 5/5 throughout.   Skin: Warm and dry. No rashes present.  Neurologic: grossly intact. Sensation intact and symmetric to light touch. Normal tone. Steady gait.   Psychiatric: Alert and oriented to person, place, and time. Pleasant and cooperative with exam. Does not appear anxious or depressed.    The 10-year ASCVD risk score (Arnett DK, et al., 2019) is: 2.6%    Values used to calculate the score:      Age: 89 years      Sex: Female      Is Non-Hispanic African American: No      Diabetic: No      Tobacco smoker: No      Systolic Blood Pressure: 124 mmHg      Is BP treated: Yes      HDL Cholesterol: 40 mg/dL      Total Cholesterol: 181 mg/dL    Laboratory Studies/Data review:  Labs ordered today, pending.   Lab Results   Component Value Date    CHOLESTEROL 181 08/31/2021    HDLCHOL 40 (L) 08/31/2021    LDLCHOL 120 (H) 08/31/2021    TRIG 116 08/31/2021     No results found for: "GLUCOSEFAST"  No results found for: "HA1C"    ASSESSMENT/PLAN     Orders Placed This Encounter    Tdap Vaccine (Boostrix) >= 10 yrs    LIPID PANEL    Glucose Fasting    REFER TO SLEEP DISORDER POC (MED SP)    Refer to [facility],Physician Office Center    DME - HOME PAP (BIPAP/CPAP) DEVICE OR SUPPLIES (POSITIVE AIRWAY PRESSURE DEVICE)     (Z00.00) Annual physical exam  (primary encounter diagnosis)  Plan: LIPID PANEL, Glucose Fasting  Recommended screening guidelines discussed with patient and shared decision making completed.   - Age appropriate screening completed/ordered today.   -Patient counseled today about the following preventive/treatment guideline for her age as above: diet,exercise, health body  weight, and alcohol use recommendations.   -Recommend a minimum of thirty minutes of moderate exercise (ie walking at a brisk pace, bicycling, etc.) 4-5 days per week to prevent weight gain from age-related loss of metabolism.  Recommend increasing the intensity (ie running, jogging, and/or resistance training) and/or the duration of time spent exercising to facilitate weight loss and improve cardiac and respiratory fitness.    (Z23) Need for Tdap vaccination  Plan: Tdap Vaccine (Boostrix) >= 10 yrs           (G47.33) OSA on CPAP  Plan: DME - HOME PAP (BIPAP/CPAP) DEVICE OR SUPPLIES         (POSITIVE AIRWAY PRESSURE DEVICE), REFER TO         SLEEP DISORDER POC (MED SP)        DME order faxed to [facility] per patient.     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   Plan: Refer to [facility]      [phone]) Other secondary hypertension  Plan: chronic stable  D/c hydrochlorothiazide and will monitor BP now she is no longer taking Contave.   - recommend monitor BP at home with goal < 140/90. If BP persistently above 140/90 return to clinic sooner for evaluation. If BP 180/120 or higher report to emergency department immediately for evaluation.   -  f/u 2 months.     (F39) Mood disorder (CMS HCC)  Plan: chronic, stable.  F/u 2 months        Depression screening is negative. PHQ 2 Total: 0       Health Maintenance: Pending and Last Completed         Date Due Completion Date    Shingles Vaccine (1 of 2) Never done ---    Influenza Vaccine (1) 05/15/2022 (Originally 01/25/2022) 04/08/2013    Covid-19 Vaccine (3 - 2023-24 season) 04/16/2023 (Originally 01/25/2022) 11/05/2019    Mammography 10/12/2022 10/11/2021    Depression Screening 04/16/2023 04/15/2022    Pap smear 03/02/2026 03/02/2021    Colonoscopy 01/25/2032 01/24/2022    Adult Tdap-Td (2 - Td or Tdap) 04/15/2032 04/15/2022          Immunization History   Administered Date(s) Administered    Covid-19  Vaccine,Moderna,12 Years+ 10/08/2019, 11/05/2019    Diptheria & Tetanus Toxoid,Adsorbed, (Decavac/Tenivac) 72YRS & OLDER 05/27/1996    Influenza Vaccine, 6 month-adult 02/25/2011, 02/03/2012, 04/08/2013    Tetanus Toxoid/Diphtheria Toxoid/Acellular Pertussis Vaccine, Adsorbed (Tdap) 04/15/2022       Return in 2 months (on 06/15/2022) for BP check, Mood check.    The patient was given ample opportunity to ask questions and those questions were answered to the patient's satisfaction. The patient was encouraged to be involved in their own care, and all diagnoses, medications, and medication side-effects were discussed. The patient was told to contact me with any additional questions or concerns, and to go to the emergency department in an emergency.      The patient has been educated and verbalized understanding regarding the services provided during this visit.    Antoine Pochemanda Malky Rudzinski, APRN,FNP-BC  04/15/2022, 11:01    Antoine PocheAmanda Derril Franek, APRN,FNP-BC  FAMILY MEDICINE, [facility]  Operated by [facility]  [address]  [place] [id number]  Dept: [phone]  Dept Fax: [phone]    Patient was seen independently.    This document was generated using a voice recognition system and transcription. All documents are proofed as best as possible, but it may have misspelled words, incorrect words, or syntax and grammatical errors because of the imperfect nature of the system.

## 2022-04-16 ENCOUNTER — Encounter (HOSPITAL_BASED_OUTPATIENT_CLINIC_OR_DEPARTMENT_OTHER): Payer: Self-pay | Admitting: Registered Nurse

## 2022-04-16 NOTE — Telephone Encounter (Signed)
From: Elspeth Cho  To: Barrie Dunker  Sent: 04/16/2022 7:28 AM EST  Subject: change pharmacy store    Good Morning,   [facility] [place] This way if my meds can't be mail my son can quickly pick them up. Thank you so much for all your help yesterday.    [facility]   [address]   [place], [place]  [phone]    Thanks again,  [employer]

## 2022-04-16 NOTE — Telephone Encounter (Signed)
Pharmacy updated.  Koty Anctil, LPN

## 2022-04-17 ENCOUNTER — Other Ambulatory Visit: Payer: Self-pay

## 2022-04-17 ENCOUNTER — Encounter (INDEPENDENT_AMBULATORY_CARE_PROVIDER_SITE_OTHER): Payer: Self-pay | Admitting: Otolaryngology

## 2022-04-17 ENCOUNTER — Ambulatory Visit: Payer: Managed Care, Other (non HMO) | Attending: Otolaryngology | Admitting: Otolaryngology

## 2022-04-17 VITALS — Temp 98.3°F | Ht 62.0 in | Wt 276.7 lb

## 2022-04-17 DIAGNOSIS — M950 Acquired deformity of nose: Secondary | ICD-10-CM | POA: Insufficient documentation

## 2022-04-17 DIAGNOSIS — J310 Chronic rhinitis: Secondary | ICD-10-CM | POA: Insufficient documentation

## 2022-04-17 DIAGNOSIS — R0981 Nasal congestion: Secondary | ICD-10-CM | POA: Insufficient documentation

## 2022-04-17 MED ORDER — AZELASTINE 137 MCG (0.1 %) NASAL SPRAY
2.0000 | Freq: Two times a day (BID) | NASAL | 11 refills | Status: DC
Start: 2022-04-17 — End: 2023-04-01

## 2022-04-17 MED ORDER — FLUTICASONE PROPIONATE 50 MCG/ACTUATION NASAL SPRAY,SUSPENSION
1.0000 | Freq: Every day | NASAL | 5 refills | Status: DC
Start: 2022-04-17 — End: 2022-10-31

## 2022-04-17 NOTE — Progress Notes (Signed)
PATIENT NAME:  Sherry Matthews  MRN:  [id number]  DOB:  08-05-1970  DATE OF SERVICE: 04/17/2022    Chief Complaint:  Nasal Congestion      HPI:  Sherry Matthews is a 51 y.o. female who presents for evaluation of chronic nasal airway obstruction.  She states that she is been noticing trouble breathing through her her nose (both sides) for approximately 8-9 months.  She can not recall a definitive inciting factor.  She denies having COVID or sinus infections at that time.  She states she had a cold/upper respiratory infection in the winter of last year this started several months afterward.  She denies any recent trauma to her nose.  She denies using over-the-counter nose sprays.    She has a history of obstructive sleep apnea and for which she uses CPAP.  She notes more difficulty using the CPAP for the past 9 months.    She has tried Flonase in the past, though states that this caused some headaches and she discontinued it.  She states she is not been evaluated by an ENT physician for this until now.        Past Medical History:  Past Medical History:   Diagnosis Date    Allergic rhinitis     Asthma     uses inhalers when allergies are bad    BMI 45.0-49.9, adult (CMS HCC)     Bullous pemphigoid     Chronic left-sided lumbar radiculopathy 11/30/2020    CPAP (continuous positive airway pressure) dependence     presuure 10 per patient    Esophageal reflux     Hypertension     due to Contrave    Mood disorder (CMS HCC)     Neck problem     harder turning head to the left    OSA on CPAP 11/30/2020    Sleep apnea            Past Surgical History:  Past Surgical History:   Procedure Laterality Date    ADENOIDECTOMY      GASTROSCOPY      HX BUNIONECTOMY Right     HX CARPAL TUNNEL RELEASE Bilateral     HX CHOLECYSTECTOMY      HX COLONOSCOPY      HX WISDOM TEETH EXTRACTION      LEG SURGERY Right 2017    tib fib HW, s/p fall off ladder           Family History:  Family Medical History:       Problem Relation (Age of Onset)     Alcohol abuse Mother, Father    Cancer Mother    Cirrhosis Mother    Lung Cancer Maternal Grandfather    Obesity Mother, Father    Ovarian Cancer Mother              Social History:  Social History     Tobacco Use   Smoking Status Never   Smokeless Tobacco Never     Social History     Substance and Sexual Activity   Alcohol Use Not Currently     Social History     Occupational History    Not on file       Medications:  Outpatient Medications Marked as Taking for the 04/17/22 encounter (Office Visit) with Gasper Sells, MD   Medication Sig    azelastine (ASTELIN) 137 mcg (0.1 %) Nasal Aerosol, Spray Administer 2 Sprays into each nostril Twice daily Use in each  nostril as directed    buPROPion (WELLBUTRIN SR) 150 mg Oral tablet sustained-release 12 hr Take 1 Tablet (150 mg total) by mouth Twice daily    citalopram (CELEXA) 20 mg Oral Tablet TAKE 1 TABLET BY MOUTH ONCE A DAY    fluticasone propionate (FLONASE) 50 mcg/actuation Nasal Spray, Suspension Administer 1 Spray into each nostril Once a day    gabapentin (NEURONTIN) 300 mg Oral Capsule TAKE 1 CAPSULE BY MOUTH EVERY NIGHT.    hydroCHLOROthiazide (MICROZIDE) 12.5 mg Oral Capsule TAKE 1 CAPSULE (12.5 MG TOTAL) BY MOUTH ONCE A DAY    minocycline (MINOCIN) 100 mg Oral Capsule Take 1 Capsule (100 mg total) by mouth Twice daily    Niacinamide (NICOMIDE-T) 500 mg Oral Tablet Take 1 Tablet (500 mg total) by mouth Once a day    pantoprazole (PROTONIX) 40 mg Oral Tablet, Delayed Release (E.C.) Take 1 Tablet (40 mg total) by mouth Once a day    SYMBICORT 160-4.5 mcg/actuation Inhalation oral inhaler INHALE 2 PUFFS BY MOUTH TWICE A DAY       Allergies:  Allergies   Allergen Reactions    Latex Rash    Carrot Rash    Eggs     Peanut     Shellfish Derived     Whey     Lima Bean Hives/ Urticaria       Review of Systems:  Do you have any fevers: no   Any weight change: no   Change in your vision: no    Chest Pain: no   Shortness of Breath: yes   Stomach pain: no   Urinary  difficulity: no   Joint Pain: no   Skin Problems: yes   Weakness or Numbness: yes   Easy Bruising or Bleeding: yes Explain Bruising or Bleeding: bruise easily Excessive Thirst: yes   Seasonal Allergies: yes    All other systems reviewed and found to be negative.    Physical Exam:  Temperature 36.8 C (98.3 F), height 1.575 m (5\' 2" ), weight 126 kg (276 lb 10.8 oz).  Body mass index is 50.6 kg/m.  General Appearance: Pleasant, cooperative, healthy, and in no acute distress.  Eyes: Conjunctivae/corneas clear, PERRLA, EOM's intact.  Head and Face: Normocephalic, atraumatic.  Face symmetric, no obvious lesions.   Pinnae: Normal shape and position.     Nose:  External pyramid midline.  Nose is overall straight on frontal view.  Internally, the nasal septum is fairly midline. . Mucosa is slightly pale, edematous.  She decongested moderately using Afrin spray today.  She was able to breathe somewhat following this.   No obvious purulence, polyps, or crusts seen by anterior rhinoscopy..   She has minimal nasal valve collapse noted with moderate inspirations.    Oral Cavity/Oropharynx: No mucosal lesions, masses, or pharyngeal asymmetry.  Hypopharynx/Larynx:  voice normal.  Neck:  No palpable thyroid, salivary gland, or neck masses.  Heme/Lymph:  No cervical adenopathy.  Cardiovascular:  Good perfusion of upper extremities.  No cyanosis of the hands or fingers.  Lungs: No apparent stridorous breathing. No acute distress.  Skin: Skin warm and dry.  Neurologic: Cranial nerves:  grossly intact.  Psychiatric:  Alert and oriented x 3.        Assessment:  Assessment/Plan   1. Nasal deformity    2. Chronic nasal congestion    -This is a 51 year old female with the chronic nasal airway obstruction that she is noted for the past 8-9 months.  She does not recall any  inciting factors such as COVID or sinus infections.  She recalls having a significant cold last winter.    -She denies any trauma to her nose.  -She has overall a small,  narrow nose, without any obvious significant septal deviation.    -She has a slight component of nasal valve collapse/nasal valve insufficiency.  -Nasal allergies may be contributing.    Plan:  Orders Placed This Encounter    azelastine (ASTELIN) 137 mcg (0.1 %) Nasal Aerosol, Spray    fluticasone propionate (FLONASE) 50 mcg/actuation Nasal Spray, Suspension   -Recommend trial of Astelin and Flonase.    -Return to clinic 2-3 months.      Charmaine Downs, MD 04/17/2022, 18:29    PCP:  Antoine Poche, APRN,FNP-BC  [address]  [place] [address]   REF:  Antoine Poche, APRN,FNP-BC  [address]  [place],  [place] [address]

## 2022-04-22 ENCOUNTER — Telehealth (INDEPENDENT_AMBULATORY_CARE_PROVIDER_SITE_OTHER): Payer: Self-pay

## 2022-04-28 ENCOUNTER — Other Ambulatory Visit (HOSPITAL_BASED_OUTPATIENT_CLINIC_OR_DEPARTMENT_OTHER): Payer: Self-pay | Admitting: Registered Nurse

## 2022-04-28 DIAGNOSIS — K219 Gastro-esophageal reflux disease without esophagitis: Secondary | ICD-10-CM

## 2022-04-29 ENCOUNTER — Encounter (INDEPENDENT_AMBULATORY_CARE_PROVIDER_SITE_OTHER): Payer: Self-pay

## 2022-04-29 NOTE — Telephone Encounter (Signed)
Refill Request: [facility]  Pharmacy requests refill of Protonix.    1.  Last visit at [place]:  04/15/2022  2.  Last My Telemed/ My Chart Visit: Visit date not found   3.  Next pending visit CLP: 06/18/2022  4.  Was last visit within the past year: yes  5.  If not seen within the last year was appointment scheduled: n/a  6.  Requested Pharmacy: [facility]    Medication pended to provider for approval.    Sherry Matthews, Ambulatory Care Assistant 04/29/2022, 09:48

## 2022-04-30 ENCOUNTER — Telehealth (INDEPENDENT_AMBULATORY_CARE_PROVIDER_SITE_OTHER): Payer: Self-pay

## 2022-06-18 ENCOUNTER — Ambulatory Visit (HOSPITAL_BASED_OUTPATIENT_CLINIC_OR_DEPARTMENT_OTHER): Payer: Self-pay | Admitting: Registered Nurse

## 2022-06-19 ENCOUNTER — Ambulatory Visit (INDEPENDENT_AMBULATORY_CARE_PROVIDER_SITE_OTHER): Payer: Self-pay | Admitting: Otolaryngology

## 2022-06-25 ENCOUNTER — Other Ambulatory Visit: Payer: Self-pay

## 2022-06-25 ENCOUNTER — Ambulatory Visit: Payer: Managed Care, Other (non HMO) | Attending: Otolaryngology | Admitting: Otolaryngology

## 2022-06-25 VITALS — BP 146/94 | HR 101 | Temp 98.0°F | Ht 61.0 in | Wt 282.4 lb

## 2022-06-25 DIAGNOSIS — J3489 Other specified disorders of nose and nasal sinuses: Secondary | ICD-10-CM | POA: Insufficient documentation

## 2022-06-25 DIAGNOSIS — J309 Allergic rhinitis, unspecified: Secondary | ICD-10-CM

## 2022-06-25 DIAGNOSIS — M950 Acquired deformity of nose: Secondary | ICD-10-CM | POA: Insufficient documentation

## 2022-06-25 NOTE — Progress Notes (Signed)
Sherry Matthews    Name: Sherry Matthews, 52 y.o. female  MRN: [id number]  Date of Birth: 08/24/1970  Date of Service: 06/25/2022    Subjective: Sherry Matthews is a 52 y.o. female with past medical history as below who presents to Dr. Joni Fears clinic for follow-up of nasal obstruction. She was last seen by Dr. Jerene Canny on 04/17/22 when she was complaining of bilateral nasal obstruction. She was prescribed Astelin and Flonase then which she mentions that she has been using regularly. Today, patient reports significant improvement in her nasal breathing. She denies any rhinorrhea, nasal obstruction, or post-nasal drip. She mentions that she has been able to use her CPAP.     Of Matthews, patient has had allergy testing in the past which patient mentions reveals pan-allergies.     Past Medical History:  Past Medical History:   Diagnosis Date    Allergic rhinitis     Asthma     uses inhalers when allergies are bad    BMI 45.0-49.9, adult (CMS HCC)     Bullous pemphigoid     Chronic left-sided lumbar radiculopathy 11/30/2020    CPAP (continuous positive airway pressure) dependence     presuure 10 per patient    Esophageal reflux     Hypertension     due to Contrave    Mood disorder (CMS HCC)     Neck problem     harder turning head to the left    OSA on CPAP 11/30/2020    Sleep apnea          Past Surgical History:  Past Surgical History:   Procedure Laterality Date    Adenoidectomy      Gastroscopy      Hx bunionectomy Right     Hx carpal tunnel release Bilateral     Hx cholecystectomy      Hx colonoscopy      Hx wisdom teeth extraction      Leg surgery Right 2017     Medications:  Outpatient Medications Marked as Taking for the 06/25/22 encounter (Office Visit) with Gasper Sells, MD   Medication Sig    azelastine (ASTELIN) 137 mcg (0.1 %) Nasal Aerosol, Spray Administer 2 Sprays into each nostril Twice daily Use in each nostril as directed     buPROPion (WELLBUTRIN SR) 150 mg Oral tablet sustained-release 12 hr Take 1 Tablet (150 mg total) by mouth Twice daily    citalopram (CELEXA) 20 mg Oral Tablet TAKE 1 TABLET BY MOUTH ONCE A DAY    fluticasone propionate (FLONASE) 50 mcg/actuation Nasal Spray, Suspension Administer 1 Spray into each nostril Once a day    gabapentin (NEURONTIN) 300 mg Oral Capsule TAKE 1 CAPSULE BY MOUTH EVERY NIGHT.    hydroCHLOROthiazide (MICROZIDE) 12.5 mg Oral Capsule TAKE 1 CAPSULE (12.5 MG TOTAL) BY MOUTH ONCE A DAY    minocycline (MINOCIN) 100 mg Oral Capsule Take 1 Capsule (100 mg total) by mouth Twice daily    Niacinamide (NICOMIDE-T) 500 mg Oral Tablet Take 1 Tablet (500 mg total) by mouth Once a day    pantoprazole (PROTONIX) 40 mg Oral Tablet, Delayed Release (E.C.) TAKE 1 TABLET BY MOUTH ONCE A DAY      Family History:  Family Medical History:       Problem Relation (Age of Onset)    Alcohol abuse Mother, Father    Cancer Mother    Cirrhosis Mother  Lung Cancer Maternal Grandfather    Obesity Mother, Father    Ovarian Cancer Mother            Social History:  Social History     Occupational History    Not on file   Tobacco Use    Smoking status: Never    Smokeless tobacco: Never   Vaping Use    Vaping Use: Never used   Substance and Sexual Activity    Alcohol use: Not Currently    Drug use: Never    Sexual activity: Not on file     Allergies:  Allergies   Allergen Reactions    Latex Rash    Carrot Rash    Eggs     Peanut     Shellfish Derived     Whey     Lima Bean Hives/ Urticaria       Review of Systems:                                                          All other systems reviewed and found to be negative.    Physical Exam:  BP (Non-Invasive): (!) 146/94 Temperature: 36.7 C (98 F) Heart Rate: (!) 101   SpO2: 98 %  Height: 154.9 cm (5' 1"$ ) Weight: 128 kg (282 lb 6.6 oz) Body mass index is 53.36 kg/m.    General Appearance: Pleasant, cooperative, healthy, and in no acute distress.  Eyes: Conjunctivae/corneas  clear, PERRLA, EOM's intact.  Head and Face: Normocephalic, atraumatic.  Face symmetric, no obvious lesions.   Pinnae: Normal shape and position.      Nose:  External pyramid midline.  Nose is overall straight on frontal view.  Internally, the nasal septum is fairly midline. . Mucosa is slightly pale, edematous.  She decongested moderately using Afrin spray today.  She was able to breathe somewhat following this.   No obvious purulence, polyps, or crusts seen by anterior rhinoscopy..   She has minimal nasal valve collapse noted with moderate inspirations.     Oral Cavity/Oropharynx: No mucosal lesions, masses, or pharyngeal asymmetry.  Hypopharynx/Larynx:  voice normal.  Neck:  No palpable thyroid, salivary gland, or neck masses.  Heme/Lymph:  No cervical adenopathy.  Cardiovascular:  Good perfusion of upper extremities.  No cyanosis of the hands or fingers.  Lungs: No apparent stridorous breathing. No acute distress.  Skin: Skin warm and dry.  Neurologic: Cranial nerves:  grossly intact.  Psychiatric:  Alert and oriented x 3.    Assessment:   Sherry Matthews is a 52 y.o. female who presents for follow-up of chronic nasal obstruction. She mentions that her nasal obstruction has significantly improved while on the aforementioned nasal sprays. We will have patient continue medical management at this time.     No diagnosis found.     Plan:  No orders of the defined types were placed in this encounter.    1. Continue using Flonase and Astelin as directed.  2. Follow-up with General Otolaryngology PRN should related and/or worrisome symptoms emerge.     Kerry Dory, MD 06/25/2022 07:56       Gasper Sells, MD    CC:    PCP Barrie Dunker, APRN,FNP-BC  [place] [address]   Referring Provider Barrie Dunker, APRN,FNP-BC  [phone]  [place],  [place] [address]     Late entry for 06/25/22. I saw and examined the patient.  I reviewed the resident's Matthews.  I agree with the findings and plan of care as documented in  the resident's Matthews.  Any exceptions/additions are edited/noted.    Gasper Sells, MD

## 2022-07-02 ENCOUNTER — Encounter (HOSPITAL_BASED_OUTPATIENT_CLINIC_OR_DEPARTMENT_OTHER): Payer: Self-pay | Admitting: Registered Nurse

## 2022-07-02 ENCOUNTER — Other Ambulatory Visit: Payer: Self-pay

## 2022-07-02 ENCOUNTER — Other Ambulatory Visit (HOSPITAL_BASED_OUTPATIENT_CLINIC_OR_DEPARTMENT_OTHER): Payer: Managed Care, Other (non HMO) | Admitting: Gynecology

## 2022-07-02 ENCOUNTER — Ambulatory Visit: Payer: Managed Care, Other (non HMO) | Attending: Registered Nurse | Admitting: Registered Nurse

## 2022-07-02 VITALS — BP 136/88 | HR 101 | Temp 97.7°F | Ht 61.0 in | Wt 284.2 lb

## 2022-07-02 DIAGNOSIS — F39 Unspecified mood [affective] disorder: Secondary | ICD-10-CM | POA: Insufficient documentation

## 2022-07-02 DIAGNOSIS — M5416 Radiculopathy, lumbar region: Secondary | ICD-10-CM | POA: Insufficient documentation

## 2022-07-02 DIAGNOSIS — Z0000 Encounter for general adult medical examination without abnormal findings: Secondary | ICD-10-CM | POA: Insufficient documentation

## 2022-07-02 DIAGNOSIS — I10 Essential (primary) hypertension: Secondary | ICD-10-CM | POA: Insufficient documentation

## 2022-07-02 LAB — LIPID PANEL
CHOL/HDL RATIO: 4
CHOLESTEROL: 175 mg/dL (ref 100–200)
HDL CHOL: 44 mg/dL — ABNORMAL LOW (ref >=50)
LDL CALC: 113 mg/dL — ABNORMAL HIGH (ref <100)
NON-HDL: 131 mg/dL (ref <=190)
TRIGLYCERIDES: 95 mg/dL (ref <150)
VLDL CALC: 16 mg/dL (ref <30)

## 2022-07-02 LAB — GLUCOSE FASTING: GLUCOSE FASTING: 100 mg/dL — ABNORMAL HIGH (ref 70–99)

## 2022-07-02 MED ORDER — GABAPENTIN 300 MG CAPSULE
300.0000 mg | ORAL_CAPSULE | Freq: Every evening | ORAL | 1 refills | Status: DC
Start: 2022-07-02 — End: 2022-11-06

## 2022-07-02 MED ORDER — HYDROCHLOROTHIAZIDE 12.5 MG CAPSULE
12.5000 mg | ORAL_CAPSULE | Freq: Every day | ORAL | 1 refills | Status: DC
Start: 2022-07-02 — End: 2022-09-05

## 2022-07-02 MED ORDER — CITALOPRAM 20 MG TABLET
20.0000 mg | ORAL_TABLET | Freq: Every day | ORAL | 11 refills | Status: DC
Start: 2022-07-02 — End: 2023-07-07

## 2022-07-02 MED ORDER — BUPROPION HCL XL 150 MG 24 HR TABLET, EXTENDED RELEASE
150.0000 mg | ORAL_TABLET | Freq: Every day | ORAL | 11 refills | Status: DC
Start: 2022-07-02 — End: 2023-07-07

## 2022-07-02 NOTE — Progress Notes (Signed)
[facility]: Family Medicine Office Visit      Service date: 07/02/2022:     MRN: [id number]   Birth date: 01/21/1971  PCP: Elisah Parmer, APRN,FNP-BC    Sherry Matthews is a 51 y.o. female who presents to [facility] today with chief complaint of Multiple medical problems follow up (BP and mood)    HPI   APP Student Note:    Presenting for BP and mood check     Hx of mood disorder currently taking Bupropion BID and Celexa.  Not taking second dose of Bupropion due to insomnia.  Interested in changing to [facility]day ER.  Feels anxiety is well controlled overall with medication.     One week trial of discontinuing HCTZ. Initially HCTZ was started due to s/e Contrave.  After stopping HCTZ one week, BP was elevated in 140's/80's. Restarted medication with improvement. Occasional "spikes" up to 140's systolic twice weekly. Had headaches when BP elevated. Denies headaches, dizziness, dyspnea, chest pain.    Hx of Insomnia and OSA. Scheduled for sleep clinic in march. Sleeps in 2 hour increments usually totaling 5-6 hours nightly. Denies OTC sleep aids. Trouble with CPAP, needs new one. Was unable to get records of prior sleep study in [place].     See ROS for further details.  ASSESSMENT/PLAN       ICD-10-CM    1. Primary hypertension  I10 hydroCHLOROthiazide (MICROZIDE) 12.5 mg Oral Capsule      2. Mood disorder (CMS HCC)  F39 citalopram (CELEXA) 20 mg Oral Tablet     buPROPion (WELLBUTRIN XL) 150 mg extended release 24 hr tablet      3. Chronic left-sided lumbar radiculopathy  M54.16 gabapentin (NEURONTIN) 300 mg Oral Capsule        1. Mood disorder (CMS HCC)  Chronic, stable change Wellbutrin to ER, continue Celexa  - citalopram (CELEXA) 20 mg Oral Tablet; Take 1 Tablet (20 mg total) by mouth Once a day  Dispense: 90 Tablet; Refill: 11  - buPROPion (WELLBUTRIN XL) 150 mg extended release 24 hr tablet; Take 1 Tablet (150 mg total) by mouth Once a day  Dispense: 90 Tablet; Refill: 11  - Discussed need for emergency  services with [facility]  - f/u 6 months.     2. Chronic left-sided lumbar radiculopathy  Chronic, stable. Continue medications  - gabapentin (NEURONTIN) 300 mg Oral Capsule; Take 1 Capsule (300 mg total) by mouth Every night  Dispense: 90 Capsule; Refill: 1  [facility] was last reviewed on 07/02/2022  8:00 AM by [place] and result was REVIEWED PDMP.     Based on this, I have no concerns for prescribing controlled substances at this time.  F/u 6 months      3. Primary hypertension  Chronic, stable   - continue hydroCHLOROthiazide (MICROZIDE) 12.5 mg Oral Capsule; Take 1 Capsule (12.5 mg total) by mouth Once a day  Dispense: 90 Capsule; Refill: 1  - Continue current dose, F/U in 2 months for possible adjustment  - Monitor BP at home daily   - recommend monitor BP at home with goal < 140/90. If BP persistently above 140/90 return to clinic sooner for evaluation. If BP 180/120 or higher report to emergency department immediately for evaluation.       Return in 2 months (on 08/31/2022) for BP check.    The patient was given ample opportunity to ask questions and those questions were answered to the patient's satisfaction. The patient was encouraged to be   involved in their own care, and all diagnoses, medications, and medication side-effects were discussed. The patient was told to contact me with any additional questions or concerns, and to go to the emergency department in an emergency.     ROS   Review of Systems:  Review of Systems   Constitutional: Negative.    HENT: Negative.     Respiratory: Negative.     Cardiovascular: Negative.    Psychiatric/Behavioral:  Positive for sleep disturbance. The patient is not nervous/anxious.      Pertinent discussed in HPI, otherwise all other systems negative.   MEDICAL HISTORY   Medical History:  Problem list, PMHx, PSHx, FHx, SHx, allergies, and current Rx were reviewed and updated as appropriate.  Past Medical History:   Diagnosis Date    Allergic rhinitis     Asthma      uses inhalers when allergies are bad    BMI 45.0-49.9, adult (CMS HCC)     Bullous pemphigoid     Chronic left-sided lumbar radiculopathy 11/30/2020    CPAP (continuous positive airway pressure) dependence     presuure 10 per patient    Esophageal reflux     Hypertension     due to Contrave    Mood disorder (CMS HCC)     Neck problem     harder turning head to the left    OSA on CPAP 11/30/2020    Sleep apnea       Outpatient Medications Marked as Taking for the 07/02/22 encounter (Office Visit) with [place], APRN,FNP-BC   Medication Sig    azelastine (ASTELIN) 137 mcg (0.1 %) Nasal Aerosol, Spray Administer 2 Sprays into each nostril Twice daily Use in each nostril as directed    buPROPion (WELLBUTRIN XL) 150 mg extended release 24 hr tablet Take 1 Tablet (150 mg total) by mouth Once a day    citalopram (CELEXA) 20 mg Oral Tablet Take 1 Tablet (20 mg total) by mouth Once a day    fluticasone propionate (FLONASE) 50 mcg/actuation Nasal Spray, Suspension Administer 1 Spray into each nostril Once a day    gabapentin (NEURONTIN) 300 mg Oral Capsule Take 1 Capsule (300 mg total) by mouth Every night    hydroCHLOROthiazide (MICROZIDE) 12.5 mg Oral Capsule Take 1 Capsule (12.5 mg total) by mouth Once a day    minocycline (MINOCIN) 100 mg Oral Capsule Take 1 Capsule (100 mg total) by mouth Twice daily    Niacinamide (NICOMIDE-T) 500 mg Oral Tablet Take 1 Tablet (500 mg total) by mouth Once a day    pantoprazole (PROTONIX) 40 mg Oral Tablet, Delayed Release (E.C.) TAKE 1 TABLET BY MOUTH ONCE A DAY    SYMBICORT 160-4.5 mcg/actuation Inhalation oral inhaler INHALE 2 PUFFS BY MOUTH TWICE A DAY     OBJECTIVE   OBJECTIVE    BP 136/88   Pulse (!) 101   Temp 36.5 C (97.7 F) (Thermal Scan)   Ht 1.549 m (5' 1")   Wt 129 kg (284 lb 2.8 oz)   BMI 53.69 kg/m     No LMP recorded. (Menstrual status: Irregular cycle).Body mass index is 53.69 kg/m.  Weight/BMI:  Wt Readings from Last 5 Encounters:   07/02/22 129 kg (284 lb  2.8 oz)   06/25/22 128 kg (282 lb 6.6 oz)   04/17/22 126 kg (276 lb 10.8 oz)   04/15/22 125 kg (275 lb 2.2 oz)   01/24/22 122 kg (269 lb 6.4 oz)        Physical Exam  Constitutional:       Appearance: Normal appearance. She is normal weight.   Cardiovascular:      Rate and Rhythm: Normal rate and regular rhythm.   Pulmonary:      Effort: Pulmonary effort is normal.      Breath sounds: Normal breath sounds.   Abdominal:      General: Bowel sounds are normal.      Palpations: Abdomen is soft.   Neurological:      Mental Status: She is alert.   Psychiatric:         Mood and Affect: Mood normal.         Behavior: Behavior normal.         Thought Content: Thought content normal.         Laboratory Studies/Data review:  Not Applicable to today's encounter       Sherry Matthews  07/02/2022, 08:01  [place] [facility], [facility]  Operated by [facility]  [address]  [place] [address]  Dept: [phone]  Dept Fax: [phone]    Patient seen independently.   I have assisted student with visit and exam and completed face to face with the patient at time of visit.  I have reviewed the documentation, orders, and diagnoses. I agree with the findings of the visit and the plan as above.     Barrie Dunker, APRN,FNP-BC2/10/2022 08:28    Barrie Dunker, APRN,FNP-BC  [facility], [facility]  [place] [address]  [phone]      This document was generated using a voice recognition system and transcription. All documents are proofed as best as possible, but it may have misspelled words, incorrect words, or syntax and grammatical errors because of the imperfect nature of the system.

## 2022-07-02 NOTE — Result Encounter Note (Signed)
I have reviewed your labs and all were normal or within acceptable range. No changes in medications or further testing indicated at this time.     Please keep your scheduled follow up visit. If you are have any further issues or need to speak sooner, please call [phone] so we can assist you.    Thank you for allowing [place] to take care of you.      The 10-year ASCVD risk score (Arnett DK, et al., 2019) is: 2.7%    Values used to calculate the score:      Age: 42 years      Sex: Female      Is Non-Hispanic African American: No      Diabetic: No      Tobacco smoker: No      Systolic Blood Pressure: 163 mmHg      Is BP treated: Yes      HDL Cholesterol: 44 mg/dL      Total Cholesterol: 175 mg/dL

## 2022-07-02 NOTE — Progress Notes (Signed)
[facility]: Family Medicine Office Visit      Service date: 07/02/2022:     MRN: [id number]   Birth date: November 13, 1970  PCP: Barrie Dunker, APRN,FNP-BC    Sherry Matthews is a 52 y.o. female who presents to [facility] today with chief complaint of Multiple medical problems follow up (BP and mood)    HPI   APP Student Note:    Presenting for BP and mood check     Hx of mood disorder currently taking Bupropion BID and Celexa.  Not taking second dose of Bupropion due to insomnia.  Interested in changing to [facility].  Feels anxiety is well controlled overall with medication.     One week trial of discontinuing HCTZ. Initially HCTZ was started due to s/e Contrave.  After stopping HCTZ one week, BP was elevated in 140's/80's. Restarted medication with improvement. Occasional "spikes" up to 784'O systolic twice weekly. Had headaches when BP elevated. Denies headaches, dizziness, dyspnea, chest pain.    Hx of Insomnia and OSA. Scheduled for sleep clinic in march. Sleeps in 2 hour increments usually totaling 5-6 hours nightly. Denies OTC sleep aids. Trouble with CPAP, needs new one. Was unable to get records of prior sleep study in [place].     See ROS for further details.  ASSESSMENT/PLAN       ICD-10-CM    1. Primary hypertension  I10 hydroCHLOROthiazide (MICROZIDE) 12.5 mg Oral Capsule      2. Mood disorder (CMS HCC)  F39 citalopram (CELEXA) 20 mg Oral Tablet     buPROPion (WELLBUTRIN XL) 150 mg extended release 24 hr tablet      3. Chronic left-sided lumbar radiculopathy  M54.16 gabapentin (NEURONTIN) 300 mg Oral Capsule        1. Mood disorder (CMS HCC)  Chronic, stable change Wellbutrin to ER, continue Celexa  - citalopram (CELEXA) 20 mg Oral Tablet; Take 1 Tablet (20 mg total) by mouth Once a day  Dispense: 90 Tablet; Refill: 11  - buPROPion (WELLBUTRIN XL) 150 mg extended release 24 hr tablet; Take 1 Tablet (150 mg total) by mouth Once a day  Dispense: 90 Tablet; Refill: 11  - Discussed need for emergency  services with SI/HI  - f/u 6 months.     2. Chronic left-sided lumbar radiculopathy  Chronic, stable. Continue medications  - gabapentin (NEURONTIN) 300 mg Oral Capsule; Take 1 Capsule (300 mg total) by mouth Every night  Dispense: 90 Capsule; Refill: 1  [facility] was last reviewed on 07/02/2022  8:00 AM by Clent Demark and result was REVIEWED PDMP.     Based on this, I have no concerns for prescribing controlled substances at this time.  F/u 6 months      3. Primary hypertension  Chronic, stable   - continue hydroCHLOROthiazide (MICROZIDE) 12.5 mg Oral Capsule; Take 1 Capsule (12.5 mg total) by mouth Once a day  Dispense: 90 Capsule; Refill: 1  - Continue current dose, F/U in 2 months for possible adjustment  - Monitor BP at home daily   - recommend monitor BP at home with goal < 140/90. If BP persistently above 140/90 return to clinic sooner for evaluation. If BP 180/120 or higher report to emergency department immediately for evaluation.       Return in 2 months (on 08/31/2022) for BP check.    The patient was given ample opportunity to ask questions and those questions were answered to the patient's satisfaction. The patient was encouraged to be  involved in their own care, and all diagnoses, medications, and medication side-effects were discussed. The patient was told to contact me with any additional questions or concerns, and to go to the emergency department in an emergency.     ROS   Review of Systems:  Review of Systems   Constitutional: Negative.    HENT: Negative.     Respiratory: Negative.     Cardiovascular: Negative.    Psychiatric/Behavioral:  Positive for sleep disturbance. The patient is not nervous/anxious.      Pertinent discussed in HPI, otherwise all other systems negative.   MEDICAL HISTORY   Medical History:  Problem list, PMHx, PSHx, FHx, SHx, allergies, and current Rx were reviewed and updated as appropriate.  Past Medical History:   Diagnosis Date    Allergic rhinitis     Asthma      uses inhalers when allergies are bad    BMI 45.0-49.9, adult (CMS HCC)     Bullous pemphigoid     Chronic left-sided lumbar radiculopathy 11/30/2020    CPAP (continuous positive airway pressure) dependence     presuure 10 per patient    Esophageal reflux     Hypertension     due to Contrave    Mood disorder (CMS HCC)     Neck problem     harder turning head to the left    OSA on CPAP 11/30/2020    Sleep apnea       Outpatient Medications Marked as Taking for the 07/02/22 encounter (Office Visit) with Barrie Dunker, APRN,FNP-BC   Medication Sig    azelastine (ASTELIN) 137 mcg (0.1 %) Nasal Aerosol, Spray Administer 2 Sprays into each nostril Twice daily Use in each nostril as directed    buPROPion (WELLBUTRIN XL) 150 mg extended release 24 hr tablet Take 1 Tablet (150 mg total) by mouth Once a day    citalopram (CELEXA) 20 mg Oral Tablet Take 1 Tablet (20 mg total) by mouth Once a day    fluticasone propionate (FLONASE) 50 mcg/actuation Nasal Spray, Suspension Administer 1 Spray into each nostril Once a day    gabapentin (NEURONTIN) 300 mg Oral Capsule Take 1 Capsule (300 mg total) by mouth Every night    hydroCHLOROthiazide (MICROZIDE) 12.5 mg Oral Capsule Take 1 Capsule (12.5 mg total) by mouth Once a day    minocycline (MINOCIN) 100 mg Oral Capsule Take 1 Capsule (100 mg total) by mouth Twice daily    Niacinamide (NICOMIDE-T) 500 mg Oral Tablet Take 1 Tablet (500 mg total) by mouth Once a day    pantoprazole (PROTONIX) 40 mg Oral Tablet, Delayed Release (E.C.) TAKE 1 TABLET BY MOUTH ONCE A DAY    SYMBICORT 160-4.5 mcg/actuation Inhalation oral inhaler INHALE 2 PUFFS BY MOUTH TWICE A DAY     OBJECTIVE   OBJECTIVE    BP 136/88   Pulse (!) 101   Temp 36.5 C (97.7 F) (Thermal Scan)   Ht 1.549 m (5\' 1" )   Wt 129 kg (284 lb 2.8 oz)   BMI 53.69 kg/m     No LMP recorded. (Menstrual status: Irregular cycle).Body mass index is 53.69 kg/m.  Weight/BMI:  Wt Readings from Last 5 Encounters:   07/02/22 129 kg (284 lb  2.8 oz)   06/25/22 128 kg (282 lb 6.6 oz)   04/17/22 126 kg (276 lb 10.8 oz)   04/15/22 125 kg (275 lb 2.2 oz)   01/24/22 122 kg (269 lb 6.4 oz)  Physical Exam  Constitutional:       Appearance: Normal appearance. She is normal weight.   Cardiovascular:      Rate and Rhythm: Normal rate and regular rhythm.   Pulmonary:      Effort: Pulmonary effort is normal.      Breath sounds: Normal breath sounds.   Abdominal:      General: Bowel sounds are normal.      Palpations: Abdomen is soft.   Neurological:      Mental Status: She is alert.   Psychiatric:         Mood and Affect: Mood normal.         Behavior: Behavior normal.         Thought Content: Thought content normal.         Laboratory Studies/Data review:  Not Applicable to today's encounter       Liliana Cline  07/02/2022, 08:01  [place] [facility], [facility]  Operated by [facility]  [address]  [place] [address]  Dept: [phone]  Dept Fax: [phone]    Patient seen independently.   I have assisted student with visit and exam and completed face to face with the patient at time of visit.  I have reviewed the documentation, orders, and diagnoses. I agree with the findings of the visit and the plan as above.     Barrie Dunker, APRN,FNP-BC2/10/2022 08:28    Barrie Dunker, APRN,FNP-BC  [facility]  [facility], [facility]  [place] [address]  [phone]      This document was generated using a voice recognition system and transcription. All documents are proofed as best as possible, but it may have misspelled words, incorrect words, or syntax and grammatical errors because of the imperfect nature of the system.

## 2022-07-13 ENCOUNTER — Encounter (HOSPITAL_BASED_OUTPATIENT_CLINIC_OR_DEPARTMENT_OTHER): Payer: Self-pay | Admitting: Registered Nurse

## 2022-08-15 ENCOUNTER — Ambulatory Visit (INDEPENDENT_AMBULATORY_CARE_PROVIDER_SITE_OTHER): Payer: Self-pay

## 2022-08-22 ENCOUNTER — Ambulatory Visit (INDEPENDENT_AMBULATORY_CARE_PROVIDER_SITE_OTHER): Payer: Self-pay

## 2022-09-05 ENCOUNTER — Encounter (HOSPITAL_BASED_OUTPATIENT_CLINIC_OR_DEPARTMENT_OTHER): Payer: Self-pay | Admitting: Registered Nurse

## 2022-09-05 ENCOUNTER — Other Ambulatory Visit (HOSPITAL_BASED_OUTPATIENT_CLINIC_OR_DEPARTMENT_OTHER): Payer: Managed Care, Other (non HMO) | Admitting: Gynecology

## 2022-09-05 ENCOUNTER — Other Ambulatory Visit: Payer: Self-pay

## 2022-09-05 ENCOUNTER — Ambulatory Visit: Payer: Managed Care, Other (non HMO) | Attending: Registered Nurse | Admitting: Registered Nurse

## 2022-09-05 ENCOUNTER — Other Ambulatory Visit (HOSPITAL_BASED_OUTPATIENT_CLINIC_OR_DEPARTMENT_OTHER): Payer: Self-pay | Admitting: Registered Nurse

## 2022-09-05 VITALS — BP 136/86 | HR 108 | Temp 97.0°F | Ht 61.0 in | Wt 295.0 lb

## 2022-09-05 DIAGNOSIS — G4733 Obstructive sleep apnea (adult) (pediatric): Secondary | ICD-10-CM

## 2022-09-05 DIAGNOSIS — E876 Hypokalemia: Secondary | ICD-10-CM

## 2022-09-05 DIAGNOSIS — Z1231 Encounter for screening mammogram for malignant neoplasm of breast: Secondary | ICD-10-CM | POA: Insufficient documentation

## 2022-09-05 DIAGNOSIS — F39 Unspecified mood [affective] disorder: Secondary | ICD-10-CM | POA: Insufficient documentation

## 2022-09-05 DIAGNOSIS — I10 Essential (primary) hypertension: Secondary | ICD-10-CM | POA: Insufficient documentation

## 2022-09-05 DIAGNOSIS — R7309 Other abnormal glucose: Secondary | ICD-10-CM

## 2022-09-05 LAB — BASIC METABOLIC PANEL
ANION GAP: 11 mmol/L (ref 4–13)
BUN/CREA RATIO: 21 (ref 6–22)
BUN: 16 mg/dL (ref 8–25)
CALCIUM: 8.4 mg/dL — ABNORMAL LOW (ref 8.6–10.2)
CHLORIDE: 103 mmol/L (ref 96–111)
CO2 TOTAL: 23 mmol/L (ref 22–30)
CREATININE: 0.75 mg/dL (ref 0.60–1.05)
ESTIMATED GFR - FEMALE: >90 mL/min/BSA (ref >=60)
GLUCOSE: 110 mg/dL (ref 65–125)
POTASSIUM: 3.3 mmol/L — ABNORMAL LOW (ref 3.5–5.1)
SODIUM: 137 mmol/L (ref 136–145)

## 2022-09-05 LAB — HGA1C (HEMOGLOBIN A1C WITH EST AVG GLUCOSE)
ESTIMATED AVERAGE GLUCOSE: 117 mg/dL
HEMOGLOBIN A1C: 5.7 % — ABNORMAL HIGH (ref 4.0–5.6)

## 2022-09-05 MED ORDER — POTASSIUM CHLORIDE ER 20 MEQ TABLET,EXTENDED RELEASE(PART/CRYST)
20.0000 meq | ORAL_TABLET | Freq: Two times a day (BID) | ORAL | 0 refills | Status: AC
Start: 2022-09-05 — End: 2022-09-06

## 2022-09-05 MED ORDER — HYDROCHLOROTHIAZIDE 12.5 MG CAPSULE
12.5000 mg | ORAL_CAPSULE | Freq: Every day | ORAL | 11 refills | Status: DC
Start: 2022-09-05 — End: 2023-08-15

## 2022-09-05 NOTE — Progress Notes (Signed)
[facility] Physicians: Family Medicine Office Visit      Service date: 09/05/2022:     MRN: [id number]   Birth date: [birth year]  PCP: Antoine Poche, APRN,FNP-BC    Sherry Matthews is a 52 y.o. female who presents to [facility] today with chief complaint of Blood Pressure F/U    HPI     Presenting for previously elevated BP after discontinuing HCTZ. Notes normal BP at home without issues. Has not taken dose for today. No concerns at this time. Denies Dizziness, headaches, chest pain, dyspnea.    Tolerating Wellbutrin XL well without s/e. Feels anxiety is well controlled overall with medication.      New symptom of fatigue after every meal for 1-2 months. Notes poor sleep averaging 6 hours nightly. No recent diet changes. Denies abdominal pain, n/v/c/d.    Recent Covid-19 infection 3/18. Same date as sleep study  for new CPAP evaluation and had to cancel. Plans to reschedule at a later date. Lingering symptom of right leg intermittent dull aching pain. Pain runs along past surgical site of  right lateral leg implanted rod. Denies skin changes, trauma, fevers, swelling, SOB, or chest pain.     See ROS for further details.  ASSESSMENT/PLAN       ICD-10-CM    1. Primary hypertension  I10 hydroCHLOROthiazide (MICROZIDE) 12.5 mg Oral Capsule     BASIC METABOLIC PANEL      2. Mood disorder (CMS HCC)  F39       3. OSA on CPAP  G47.33       4. Elevated glucose  R73.09 Hemoglobin A1C     BASIC METABOLIC PANEL      5. Encounter for screening mammogram for breast cancer  Z12.31 MAMMO BILATERAL SCREENING-ADDL VIEWS/BREAST US AS REQ BY RAD        1. Primary hypertension  Chronic, controlled, continue medications  - hydroCHLOROthiazide (MICROZIDE) 12.5 mg Oral Capsule; Take 1 Capsule (12.5 mg total) by mouth Once a day  Dispense: 90 Capsule; Refill: 11  - BASIC METABOLIC PANEL; Future    2. Mood disorder (CMS HCC)  Chronic, controlled, continue medication Wellbutrin XL 150 mg  - F/U in 6 months    3. OSA on CPAP  Chronic,  uncontrolled  - Plans to reschedule sleep study    4. Elevated glucose  New onset.   - Hemoglobin A1C; Future  - BASIC METABOLIC PANEL; Future  - Ruling out metabolic process for fatigue symptoms    5. Encounter for screening mammogram for breast cancer  - MAMMO BILATERAL SCREENING-ADDL VIEWS/BREAST US AS REQ BY RAD; Future     Return in 6 months (on 03/07/2023) for Medication Check, BP check.    The patient was given ample opportunity to ask questions and those questions were answered to the patient's satisfaction. The patient was encouraged to be involved in their own care, and all diagnoses, medications, and medication side-effects were discussed. The patient was told to contact me with any additional questions or concerns, and to go to the emergency department in an emergency.     ROS   Review of Systems:  Review of Systems   Constitutional:  Positive for fatigue. Negative for chills and fever.   Respiratory: Negative.  Negative for chest tightness and shortness of breath.    Cardiovascular: Negative.  Negative for chest pain and leg swelling.   Gastrointestinal: Negative.  Negative for abdominal pain, constipation, diarrhea, nausea and vomiting.   Musculoskeletal:  Positive for  myalgias.   Skin:  Negative for color change, rash and wound.   Neurological: Negative.  Negative for light-headedness, numbness and headaches.   Psychiatric/Behavioral:  Positive for sleep disturbance. Negative for agitation. The patient is not nervous/anxious.      Pertinent discussed in HPI, otherwise all other systems negative.   MEDICAL HISTORY   Medical History:  Problem list, PMHx, PSHx, FHx, SHx, allergies, and current Rx were reviewed and updated as appropriate.  Past Medical History:   Diagnosis Date    Allergic rhinitis     Asthma     uses inhalers when allergies are bad    BMI 45.0-49.9, adult (CMS HCC)     Bullous pemphigoid     Chronic left-sided lumbar radiculopathy 11/30/2020    CPAP (continuous positive airway pressure)  dependence     presuure 10 per patient    Esophageal reflux     Hypertension     due to Contrave    Mood disorder (CMS HCC)     Neck problem     harder turning head to the left    OSA on CPAP 11/30/2020    Sleep apnea       Outpatient Medications Marked as Taking for the 09/05/22 encounter (Office Visit) with Antoine Poche, APRN,FNP-BC   Medication Sig    azelastine (ASTELIN) 137 mcg (0.1 %) Nasal Aerosol, Spray Administer 2 Sprays into each nostril Twice daily Use in each nostril as directed    buPROPion (WELLBUTRIN XL) 150 mg extended release 24 hr tablet Take 1 Tablet (150 mg total) by mouth Once a day    citalopram (CELEXA) 20 mg Oral Tablet Take 1 Tablet (20 mg total) by mouth Once a day    fluticasone propionate (FLONASE) 50 mcg/actuation Nasal Spray, Suspension Administer 1 Spray into each nostril Once a day    gabapentin (NEURONTIN) 300 mg Oral Capsule Take 1 Capsule (300 mg total) by mouth Every night    hydroCHLOROthiazide (MICROZIDE) 12.5 mg Oral Capsule Take 1 Capsule (12.5 mg total) by mouth Once a day    minocycline (MINOCIN) 100 mg Oral Capsule Take 1 Capsule (100 mg total) by mouth Twice daily    Niacinamide (NICOMIDE-T) 500 mg Oral Tablet Take 1 Tablet (500 mg total) by mouth Once a day    pantoprazole (PROTONIX) 40 mg Oral Tablet, Delayed Release (E.C.) TAKE 1 TABLET BY MOUTH ONCE A DAY    SYMBICORT 160-4.5 mcg/actuation Inhalation oral inhaler INHALE 2 PUFFS BY MOUTH TWICE A DAY     OBJECTIVE   OBJECTIVE    BP 136/86   Pulse (!) 108   Temp 36.1 C (97 F) (Thermal Scan)   Ht 1.549 m ( )   Wt 134 kg (294 lb 15.6 oz)   BMI 55.74 kg/m     No LMP recorded. (Menstrual status: Irregular cycle).Body mass index is 55.74 kg/m.  Weight/BMI:  Wt Readings from Last 5 Encounters:   09/05/22 134 kg (294 lb 15.6 oz)   07/02/22 129 kg (284 lb 2.8 oz)   06/25/22 128 kg (282 lb 6.6 oz)   04/17/22 126 kg (276 lb 10.8 oz)   04/15/22 125 kg (275 lb 2.2 oz)      Physical Exam  Constitutional:        Appearance: Normal appearance. She is normal weight.   Cardiovascular:      Rate and Rhythm: Normal rate and regular rhythm.      Pulses: Normal pulses.      Heart  sounds: Normal heart sounds.   Pulmonary:      Effort: Pulmonary effort is normal.      Breath sounds: Normal breath sounds.   Abdominal:      General: Bowel sounds are normal.      Palpations: Abdomen is soft.      Tenderness: There is no abdominal tenderness.   Musculoskeletal:      Right lower leg: Normal. No swelling. No edema.   Skin:     Coloration: Skin is not pale.      Findings: No erythema, lesion or rash.   Neurological:      Mental Status: She is alert.         Laboratory Studies/Data review:  BASIC METABOLIC PANEL  Lab Results   Component Value Date    SODIUM 137 08/31/2021    POTASSIUM 3.7 08/31/2021    CHLORIDE 101 08/31/2021    CO2 23 08/31/2021    ANIONGAP 13 08/31/2021    BUN 18 08/31/2021    CREATININE 1.02 08/31/2021    BUNCRRATIO 18 08/31/2021    GFR 66 08/31/2021    CALCIUM 9.5 08/31/2021          Bufford Spikes  09/05/2022, 08:18  [facility] Spikes  F[facility]ILY [facility]IC[facility], [facility]  Operated by [facility]  [address]  [place] [address]  Dept: [phone]  Dept Fax: [phone]    Patient seen independently.   I saw and examined the patient.  I reviewed the APP Student's note.  I agree with the findings and plan of care as documented in the APP Student's note.  Any exceptions/additions are edited/noted.    Antoine Poche, APRN,FNP-BC4/03/2023 09:27    Antoine Poche, APRN,FNP-BC  [facility]  [facility], [facility]  [address]  [place] [address]  [phone]      This document was generated using a voice recognition system and transcription. All documents are proofed as best as possible, but it may have misspelled words, incorrect words, or syntax and grammatical errors because of the imperfect nature of the system.

## 2022-09-06 ENCOUNTER — Encounter (HOSPITAL_BASED_OUTPATIENT_CLINIC_OR_DEPARTMENT_OTHER): Payer: Self-pay

## 2022-10-15 ENCOUNTER — Encounter (HOSPITAL_BASED_OUTPATIENT_CLINIC_OR_DEPARTMENT_OTHER): Payer: Self-pay

## 2022-10-15 ENCOUNTER — Inpatient Hospital Stay
Admission: RE | Admit: 2022-10-15 | Discharge: 2022-10-15 | Disposition: A | Discharge status: Home / Self Care | Payer: Managed Care, Other (non HMO) | Source: Ambulatory Visit | Attending: Registered Nurse | Admitting: Registered Nurse

## 2022-10-15 ENCOUNTER — Other Ambulatory Visit: Payer: Self-pay

## 2022-10-15 DIAGNOSIS — Z1231 Encounter for screening mammogram for malignant neoplasm of breast: Secondary | ICD-10-CM | POA: Insufficient documentation

## 2022-10-15 NOTE — Result Encounter Note (Signed)
Just wanted to let you know that there were no suspicious areas noted on your mammogram. Thank you for completing this screening exam. It is recommended that you have a repeat mammogram in one year to continue surveillance for breast cancer. Please let me know if you have questions! Bartt Gonzaga, APRN,FNP-BC

## 2022-10-31 ENCOUNTER — Other Ambulatory Visit (INDEPENDENT_AMBULATORY_CARE_PROVIDER_SITE_OTHER): Payer: Self-pay | Admitting: Otolaryngology

## 2022-10-31 NOTE — Telephone Encounter (Signed)
LOV 06/25/22

## 2022-11-06 ENCOUNTER — Other Ambulatory Visit (HOSPITAL_BASED_OUTPATIENT_CLINIC_OR_DEPARTMENT_OTHER): Payer: Self-pay | Admitting: Registered Nurse

## 2022-11-06 DIAGNOSIS — M5416 Radiculopathy, lumbar region: Secondary | ICD-10-CM

## 2022-11-07 ENCOUNTER — Encounter (HOSPITAL_BASED_OUTPATIENT_CLINIC_OR_DEPARTMENT_OTHER): Payer: Self-pay

## 2022-11-07 NOTE — Telephone Encounter (Signed)
Looked at chart and it seems that patient should have enough refills of gabapentin, patient message stated that she thought it was at the wrong pharmacy, but it was sent to the one she'd requested which was [facility] [place].  It looks like at that specific pharmacy, she has enough refills until August.  I will call the pharmacy again to verify, but they are closed for lunch until 2pm. Romeo Rabon, CMA

## 2022-11-11 MED ORDER — GABAPENTIN 300 MG CAPSULE
300.0000 mg | ORAL_CAPSULE | Freq: Every evening | ORAL | 1 refills | Status: DC
Start: 2022-11-11 — End: 2023-07-10

## 2022-11-17 ENCOUNTER — Encounter (HOSPITAL_BASED_OUTPATIENT_CLINIC_OR_DEPARTMENT_OTHER): Payer: Self-pay | Admitting: Registered Nurse

## 2023-01-23 ENCOUNTER — Ambulatory Visit: Payer: Managed Care, Other (non HMO) | Attending: Registered Nurse | Admitting: Internal Medicine

## 2023-01-23 ENCOUNTER — Encounter (INDEPENDENT_AMBULATORY_CARE_PROVIDER_SITE_OTHER): Payer: Self-pay

## 2023-01-23 ENCOUNTER — Other Ambulatory Visit: Payer: Self-pay

## 2023-01-23 VITALS — BP 104/79 | HR 109 | Temp 98.3°F | Ht 62.0 in | Wt 273.4 lb

## 2023-01-23 DIAGNOSIS — E669 Obesity, unspecified: Secondary | ICD-10-CM

## 2023-01-23 DIAGNOSIS — Z6841 Body Mass Index (BMI) 40.0 and over, adult: Secondary | ICD-10-CM

## 2023-01-23 DIAGNOSIS — G4719 Other hypersomnia: Secondary | ICD-10-CM

## 2023-01-23 DIAGNOSIS — G4733 Obstructive sleep apnea (adult) (pediatric): Secondary | ICD-10-CM | POA: Insufficient documentation

## 2023-01-23 NOTE — Progress Notes (Signed)
Pulmonary Medicine Clinic, Return Visit  Patient: Sherry Matthews, 52 y.o. female   MRN:  [id number]  Date: 01/23/2023     Chief Complaint: Sleep Apnea and Excessive Daytime Sleepiness    Subjective:  52 y.o. female with PMHx of class 3 obesity, OSA previously on CPAP (sleep study done in [place], [place] around the year 2000), MDD, GERD, and allergic rhinitis who presents as a new pt with the chief complaint of EDS and nocturnal snoring. Pt endorses trouble staying asleep. No issues falling asleep. Son, who lives in the house, notices snoring. Pt takes Motrin PM to help her sleep, but it doesn't provide much benefit.     Sleep Schedule - Midnight - 6:30 am, fragmented    Only able to sleep 2-3 hours straight through   Nocturia - 1-2 x/night   Caffeine - Did not ask   Exercise - Minimal, desires to start using the treadmill     ESS - 14     Work/Exposure History: Non-contributory. No reported history of asbestos/coal dust/silica exposure.   Nicotine History: None   Family History: Non-contributory. No other lung cancer/copd/asthma/autoimmune diseases reported  Pets: Dogs (allergenic), No birds    Current Outpatient Medications   Medication Sig    azelastine (ASTELIN) 137 mcg (0.1 %) Nasal Aerosol, Spray Administer 2 Sprays into each nostril Twice daily Use in each nostril as directed    buPROPion (WELLBUTRIN XL) 150 mg extended release 24 hr tablet Take 1 Tablet (150 mg total) by mouth Once a day    citalopram (CELEXA) 20 mg Oral Tablet Take 1 Tablet (20 mg total) by mouth Once a day    fluticasone propionate (FLONASE) 50 mcg/actuation Nasal Spray, Suspension SPRAY 1 SPRAY INTO EACH NOSTRIL EVERY DAY    gabapentin (NEURONTIN) 300 mg Oral Capsule Take 1 Capsule (300 mg total) by mouth Every night    hydroCHLOROthiazide (MICROZIDE) 12.5 mg Oral Capsule Take 1 Capsule (12.5 mg total) by mouth Once a day    minocycline (MINOCIN) 100 mg Oral Capsule Take 1 Capsule (100 mg total) by mouth Twice daily    Niacinamide  (NICOMIDE-T) 500 mg Oral Tablet Take 1 Tablet (500 mg total) by mouth Once a day    pantoprazole (PROTONIX) 40 mg Oral Tablet, Delayed Release (E.C.) TAKE 1 TABLET BY MOUTH ONCE A DAY    silver sulfADIAZINE (SILVADENE) 1 % Cream Apply topically Twice daily    SYMBICORT 160-4.5 mcg/actuation Inhalation oral inhaler INHALE 2 PUFFS BY MOUTH TWICE A DAY     Allergies   Allergen Reactions    Latex Rash    Carrot Rash    Eggs     Peanut     Shellfish Derived     Whey     Lima Bean Hives/ Urticaria       Review of Systems:  Denies morning headaches, parasomnias, nightmares, hypnagogic/hypnapompic hallucinations. Negative except as mentioned in HPI    Objective:  BP 104/79   Pulse (!) 109   Temp 36.8 C (98.3 F) (Thermal Scan)   Ht 1.575 m (5\' 2" )   Wt 124 kg (273 lb 5.9 oz)   SpO2 96%   BMI 50.00 kg/m       Physical Examination:  General - Patient appears in no acute distress, stated age, obese   HEENT - NC/AT with oral and nasal mucosa moist without erythema or injection. Mallampati IV  Neck - Supple, trachea midline.   Cardiovascular - Appears acyanotic, normal S1, S2 with no murmurs,  rubs, or gallops  Respiratory - Clear to auscultation bilaterally, normal respiratory effort.  Abdomen - Normal bowel sounds, nontender, nondistended  Integument - No overt rashes, lesions, or trauma noted  Extremities - Extremities present with no overt lesions noted.  Neuro - alert and oriented, GCS 15  Psych - Normal affect, thought content, memory, behavior normal     Data reviewed:  No recent unattended or attended PSG      Impression: Sherry Matthews is a 52 y.o. female who presents to sleep clinic for EDS and nocturnal snoring; she was previously diagnosed with OSA via a ANP in [place]. [place] around the year 2000. She previously used a CPAP machine, but she hasn't used it in 2 years since the machine stopped working.     Plan:  OSA, previously on CPAP  Class 3 Obesity  - Unattended sleep study ordered   Pt to bring results to  Sleep Center so appropriate CPAP settings and mask can be ordered.     Will have pt wear mask and then RTC 6-8 weeks thereafter.   - Encouraged patient to wear CPAP for at least 4 hours a night 70% of nights at a minimum.  - Counseled on health benefits of treating sleep apnea.    - Instructed to exchange mask/tubing/filters at least 2x/year, with regular cleaning in between.  - Counseled on sleep hygiene, including not to drive when fatigued.  - Counseled to minimize alcohol/caffeine intake prior to bed  - Encouraged patient to continue weight loss efforts    Orders Placed This Encounter    POLYSOMNOGRAPHY - SLEEP STUDY - UNATTENDED       Chales Salmon, MD  Family Medicine PGY-3  01/23/2023 10:03            01/25/2023  I saw and examined the patient.  I reviewed the fellow's note.  I agree with the findings and plan of care as documented in the fellow's note.  Any exceptions/additions are edited/noted.    Clovis Pu, MD

## 2023-01-23 NOTE — Nursing Note (Signed)
01/23/23 0800   Situation   Sitting and Reading 2   Watching TV 2   Sitting inactive in a public place. 3   As a passenger in a car for an hour without a break. 3   Lying down to rest in the afternoon when circumstances permit. 3   Sitting and Talking to someone 2   Sitting quietly after a lunch without alcohol 3   In a car, while stopped for a few minutes in traffic 2   Epworth Sleepiness Scale Score   Score total 20

## 2023-03-07 ENCOUNTER — Ambulatory Visit (HOSPITAL_BASED_OUTPATIENT_CLINIC_OR_DEPARTMENT_OTHER): Payer: Self-pay | Admitting: Registered Nurse

## 2023-03-14 ENCOUNTER — Other Ambulatory Visit: Payer: Self-pay

## 2023-03-14 ENCOUNTER — Encounter (HOSPITAL_BASED_OUTPATIENT_CLINIC_OR_DEPARTMENT_OTHER): Payer: Self-pay | Admitting: Registered Nurse

## 2023-03-14 ENCOUNTER — Ambulatory Visit: Payer: Managed Care, Other (non HMO) | Attending: Registered Nurse | Admitting: Registered Nurse

## 2023-03-14 ENCOUNTER — Other Ambulatory Visit (HOSPITAL_BASED_OUTPATIENT_CLINIC_OR_DEPARTMENT_OTHER): Payer: Managed Care, Other (non HMO) | Admitting: Gynecology

## 2023-03-14 VITALS — BP 132/88 | HR 111 | Temp 97.8°F | Wt 271.6 lb

## 2023-03-14 DIAGNOSIS — K219 Gastro-esophageal reflux disease without esophagitis: Secondary | ICD-10-CM | POA: Insufficient documentation

## 2023-03-14 DIAGNOSIS — R7303 Prediabetes: Secondary | ICD-10-CM

## 2023-03-14 DIAGNOSIS — I10 Essential (primary) hypertension: Secondary | ICD-10-CM | POA: Insufficient documentation

## 2023-03-14 LAB — HGA1C (HEMOGLOBIN A1C WITH EST AVG GLUCOSE)
ESTIMATED AVERAGE GLUCOSE: 94 mg/dL
HEMOGLOBIN A1C: 4.9 % (ref 4.0–5.6)

## 2023-03-14 MED ORDER — PANTOPRAZOLE 40 MG TABLET,DELAYED RELEASE
40.0000 mg | DELAYED_RELEASE_TABLET | Freq: Every day | ORAL | 3 refills | Status: DC
Start: 2023-03-14 — End: 2023-08-15

## 2023-03-14 NOTE — Progress Notes (Unsigned)
[facility]: Family Medicine Office Visit      Service date: 03/14/2023:     MRN: [id number]   Birth date: October 31, 1970  PCP: Antoine Poche, APRN,FNP-BC    Sherry Matthews is a 52 y.o. female who presents to [facility] today with chief complaint of Diabetes    HPI     Presents with concerns for elevated glucose levels.   Previous A1C showed prediabetes.   Monitoring blood sugars at home and ranging 120-160. Checking fasting and after eating.   Solid food makes glucose elevated in 160s. Has been drinking low sugar protein shakes more which she notices sugar is more controlled. Drinking 1-2 times a day since August.   Has been working on eating low carb.   Still gets tired after eating.   Denies current exercise routine or regular movement. Admits to being sedentary working remote job.   Has lost 8 lbs (weight in chart for April not accurate).     Additionally needing refill of pantoprazole.     See ROS for further details.  ASSESSMENT/PLAN       ICD-10-CM    1. Prediabetes  R73.03 HGA1C (HEMOGLOBIN A1C WITH EST AVG GLUCOSE)      2. Gastroesophageal reflux disease, unspecified whether esophagitis present  K21.9 pantoprazole (PROTONIX) 40 mg Oral Tablet, Delayed Release (E.C.)      3. Primary hypertension  I10           1. Gastroesophageal reflux disease, unspecified whether esophagitis present  Chronic, controlled. Continue medications   - pantoprazole (PROTONIX) 40 mg Oral Tablet, Delayed Release (E.C.); Take 1 Tablet (40 mg total) by mouth Once a day  Dispense: 90 Tablet; Refill: 3    2. Prediabetes  Chronic, status unknown.   Repeat labs today.  Extensive discussion and education regarding nutrition, exercise, daily movement, weight loss for glucose management. Discussed was to increase daily movement and incorporating exercise routine. Discussed accurate glucose monitoring fasting and pre / post meal to monitor how glucose levels respond (education provided in AVS).   - HGA1C (HEMOGLOBIN A1C WITH  EST AVG GLUCOSE); Future  - f/u pending results and if medication initiation is recommended.     3. Primary hypertension  Chronic, controlled. Continue medications hydrochlorothiazide.   Encouraged patient to check BPs at home and discussed goal BP. Discussed low salt diet and diet/exercise to improve cardiovascular health.           On the day of the encounter, a total of  30 minutes was spent on this patient encounter including review of historical information, examination, documentation and post-visit activities.      Return in about 6 months (around 09/12/2023) for Medication Check.    The patient was given ample opportunity to ask questions and those questions were answered to the patient's satisfaction. The patient was encouraged to be involved in their own care, and all diagnoses, medications, and medication side-effects were discussed. The patient was told to contact me with any additional questions or concerns, and to go to the emergency department in an emergency.     ROS   Review of Systems:  Review of Systems   Respiratory:  Negative for shortness of breath.    Cardiovascular:  Negative for chest pain and palpitations.   Gastrointestinal: Negative.  Negative for nausea and vomiting.   Endocrine: Negative for polydipsia, polyphagia and polyuria.     Pertinent discussed in HPI, otherwise all other systems negative.   MEDICAL HISTORY   Medical  History:  Problem list, PMHx, PSHx, FHx, SHx, allergies, and current Rx were reviewed and updated as appropriate.  Past Medical History:   Diagnosis Date    Allergic rhinitis     Asthma     uses inhalers when allergies are bad    BMI 45.0-49.9, adult (CMS HCC)     Bullous pemphigoid (CMS HCC)     Chronic left-sided lumbar radiculopathy 11/30/2020    CPAP (continuous positive airway pressure) dependence     presuure 10 per patient    Esophageal reflux     Hypertension     due to Contrave    Mood disorder (CMS HCC)     Neck problem     harder turning head to the left     OSA on CPAP 11/30/2020    Sleep apnea       Outpatient Medications Marked as Taking for the 03/14/23 encounter (Office Visit) with Antoine Poche, APRN,FNP-BC   Medication Sig    azelastine (ASTELIN) 137 mcg (0.1 %) Nasal Aerosol, Spray Administer 2 Sprays into each nostril Twice daily Use in each nostril as directed    buPROPion (WELLBUTRIN XL) 150 mg extended release 24 hr tablet Take 1 Tablet (150 mg total) by mouth Once a day    citalopram (CELEXA) 20 mg Oral Tablet Take 1 Tablet (20 mg total) by mouth Once a day    fluticasone propionate (FLONASE) 50 mcg/actuation Nasal Spray, Suspension SPRAY 1 SPRAY INTO EACH NOSTRIL EVERY DAY    gabapentin (NEURONTIN) 300 mg Oral Capsule Take 1 Capsule (300 mg total) by mouth Every night    hydroCHLOROthiazide (MICROZIDE) 12.5 mg Oral Capsule Take 1 Capsule (12.5 mg total) by mouth Once a day    minocycline (MINOCIN) 100 mg Oral Capsule Take 1 Capsule (100 mg total) by mouth Twice daily    Niacinamide (NICOMIDE-T) 500 mg Oral Tablet Take 1 Tablet (500 mg total) by mouth Once a day    pantoprazole (PROTONIX) 40 mg Oral Tablet, Delayed Release (E.C.) Take 1 Tablet (40 mg total) by mouth Once a day    silver sulfADIAZINE (SILVADENE) 1 % Cream Apply topically Twice daily    SYMBICORT 160-4.5 mcg/actuation Inhalation oral inhaler INHALE 2 PUFFS BY MOUTH TWICE A DAY     OBJECTIVE   OBJECTIVE    BP 132/88   Pulse (!) 111   Temp 36.6 C (97.8 F)   Wt 123 kg (271 lb 9.7 oz)   SpO2 96%   BMI 49.68 kg/m     No LMP recorded. (Menstrual status: Irregular cycle).Body mass index is 49.68 kg/m.  Weight/BMI:  Wt Readings from Last 5 Encounters:   03/14/23 123 kg (271 lb 9.7 oz)   01/23/23 124 kg (273 lb 5.9 oz)   09/05/22 134 kg (294 lb 15.6 oz)   07/02/22 129 kg (284 lb 2.8 oz)   06/25/22 128 kg (282 lb 6.6 oz)      Physical Exam  Vitals reviewed.   Constitutional:       General: She is not in acute distress.  Cardiovascular:      Rate and Rhythm: Normal rate and regular rhythm.       Heart sounds: Normal heart sounds. No murmur heard.  Pulmonary:      Effort: Pulmonary effort is normal. No respiratory distress.      Breath sounds: Normal breath sounds.   Neurological:      General: No focal deficit present.      Mental Status: She  is alert and oriented to person, place, and time.         Laboratory Studies/Data review:  Labs ordered today, pending.   Diabetes Monitors  A1C: 4.9  A1C Date: 03/14/2023    Patient seen independently.   Antoine Poche, APRN,FNP-BC  03/14/2023, 15:37  Antoine Poche, APRN,FNP-BC  FAMILY MEDICINE, [facility]  Operated by [facility]  [address]  [place] [address]  Dept: [phone]  Dept Fax: [phone]    This document was generated using a voice recognition system and transcription. All documents are proofed as best as possible, but it may have misspelled words, incorrect words, or syntax and grammatical errors because of the imperfect nature of the system.

## 2023-03-14 NOTE — Patient Instructions (Addendum)
HOME MONITORING Glucose:    -       Fasting blood sugar (this is first thing in the morning): 80-130 mg/dl    -       Before meals: 80-130 mg/dl    -       2-hours from start of meal: below 180 mg/dl     -       Bedtime: 47-829 mg/dl    A good way to see how different foods affect your diabetes is to check your blood sugar before a meal and again 2 hours after the start of the meal.  If the after-meal blood sugar is more than 30 to 50 points higher than the before meal, then you may have eaten too many carbohydrates.

## 2023-03-29 ENCOUNTER — Other Ambulatory Visit (INDEPENDENT_AMBULATORY_CARE_PROVIDER_SITE_OTHER): Payer: Self-pay | Admitting: Otolaryngology

## 2023-03-31 NOTE — Telephone Encounter (Signed)
Lov1/30/24

## 2023-05-08 ENCOUNTER — Other Ambulatory Visit: Payer: Self-pay

## 2023-05-08 ENCOUNTER — Ambulatory Visit
Admission: RE | Admit: 2023-05-08 | Discharge: 2023-05-08 | Disposition: A | Discharge status: Home / Self Care | Payer: Managed Care, Other (non HMO) | Source: Ambulatory Visit | Attending: Internal Medicine | Admitting: Internal Medicine

## 2023-05-08 DIAGNOSIS — G4733 Obstructive sleep apnea (adult) (pediatric): Secondary | ICD-10-CM | POA: Insufficient documentation

## 2023-05-08 DIAGNOSIS — G4719 Other hypersomnia: Secondary | ICD-10-CM | POA: Insufficient documentation

## 2023-06-03 ENCOUNTER — Encounter (HOSPITAL_BASED_OUTPATIENT_CLINIC_OR_DEPARTMENT_OTHER): Payer: Self-pay

## 2023-06-03 NOTE — Progress Notes (Signed)
Message left for patient to contact the Sleep Lab regarding Sleep STudy results.    Zona Harris RRT RPSGT

## 2023-06-09 ENCOUNTER — Encounter (HOSPITAL_BASED_OUTPATIENT_CLINIC_OR_DEPARTMENT_OTHER): Payer: Self-pay

## 2023-06-09 NOTE — Progress Notes (Signed)
Patient contacted regarding Sleep STudy results. Dr. Abbie Sons recommended f/u in clinic with her CPAP machine to look at current settings and any changes that might need made. Per patient her machine"died" over a year ago and this study was being done to get her a new machine. Message sent to Dr. Abbie Sons to see what he wanted to do.      Sherry Matthews RRT RPSGT

## 2023-06-18 ENCOUNTER — Encounter (HOSPITAL_BASED_OUTPATIENT_CLINIC_OR_DEPARTMENT_OTHER): Payer: Self-pay

## 2023-06-18 DIAGNOSIS — G4733 Obstructive sleep apnea (adult) (pediatric): Secondary | ICD-10-CM

## 2023-06-18 NOTE — Progress Notes (Signed)
Dr. Abbie Sons would like [place] to proceed with a CPAP for this patient, patient notified. She would like to get equipment from [facility].  Script and required documentation faxed via [employer] (job # [phone])    Rolla Etienne RRT RPSGT

## 2023-07-06 ENCOUNTER — Other Ambulatory Visit (HOSPITAL_BASED_OUTPATIENT_CLINIC_OR_DEPARTMENT_OTHER): Payer: Self-pay | Admitting: Registered Nurse

## 2023-07-06 DIAGNOSIS — F39 Unspecified mood [affective] disorder: Secondary | ICD-10-CM

## 2023-07-07 NOTE — Telephone Encounter (Signed)
Refill Request: [facility]  Pharmacy requests refill of BUPROPION HCL XL 150 MG TABLET .CITALOPRAM HBR 20 MG TABLET     1.  Last visit at [facility]:  03/14/2023  2.  Last My Telemed/ My Chart Visit: Visit date not found   3.  Next pending visit [facility]: 09/12/2023  4.  Was last visit within the past year: y  5.  If not seen within the last year was appointment scheduled: n  6.  Requested Pharmacy: [facility] [address], [place] - [address]     Medication pended to provider for approval.    Massie Maroon, LPN 1/61/0960, 45:40

## 2023-07-08 ENCOUNTER — Other Ambulatory Visit (HOSPITAL_BASED_OUTPATIENT_CLINIC_OR_DEPARTMENT_OTHER): Payer: Self-pay | Admitting: Registered Nurse

## 2023-07-08 DIAGNOSIS — M5416 Radiculopathy, lumbar region: Secondary | ICD-10-CM

## 2023-07-08 NOTE — Telephone Encounter (Signed)
Refill Request: [facility]  Pharmacy requests refill of GABAPENTIN 300 MG CAPSULE .    1.  Last visit at [facility]:  03/14/2023  2.  Last My Telemed/ My Chart Visit: Visit date not found   3.  Next pending visit [facility]: 09/12/2023  4.  Was last visit within the past year: y  5.  If not seen within the last year was appointment scheduled: n  6.  Requested Pharmacy: [facility] [address] [place], [place] - [address]     Medication pended to provider for approval.    Massie Maroon, LPN 1/32/4401, 02:72

## 2023-07-29 ENCOUNTER — Other Ambulatory Visit (INDEPENDENT_AMBULATORY_CARE_PROVIDER_SITE_OTHER): Payer: Self-pay | Admitting: Otolaryngology

## 2023-07-29 NOTE — Telephone Encounter (Signed)
 LOV 06/25/22, needs follow up

## 2023-08-15 ENCOUNTER — Other Ambulatory Visit: Payer: Self-pay

## 2023-08-15 ENCOUNTER — Ambulatory Visit (HOSPITAL_BASED_OUTPATIENT_CLINIC_OR_DEPARTMENT_OTHER): Payer: Self-pay | Admitting: Registered Nurse

## 2023-08-15 ENCOUNTER — Encounter (HOSPITAL_BASED_OUTPATIENT_CLINIC_OR_DEPARTMENT_OTHER): Payer: Self-pay | Admitting: Registered Nurse

## 2023-08-15 ENCOUNTER — Ambulatory Visit (HOSPITAL_BASED_OUTPATIENT_CLINIC_OR_DEPARTMENT_OTHER): Admitting: Gynecology

## 2023-08-15 ENCOUNTER — Ambulatory Visit: Attending: Registered Nurse | Admitting: Registered Nurse

## 2023-08-15 ENCOUNTER — Ambulatory Visit

## 2023-08-15 VITALS — BP 134/88 | HR 114 | Temp 98.0°F | Ht 62.0 in | Wt 282.4 lb

## 2023-08-15 DIAGNOSIS — G8929 Other chronic pain: Secondary | ICD-10-CM | POA: Insufficient documentation

## 2023-08-15 DIAGNOSIS — I10 Essential (primary) hypertension: Secondary | ICD-10-CM

## 2023-08-15 DIAGNOSIS — R7303 Prediabetes: Secondary | ICD-10-CM

## 2023-08-15 DIAGNOSIS — M5416 Radiculopathy, lumbar region: Secondary | ICD-10-CM | POA: Insufficient documentation

## 2023-08-15 DIAGNOSIS — Z1231 Encounter for screening mammogram for malignant neoplasm of breast: Secondary | ICD-10-CM | POA: Insufficient documentation

## 2023-08-15 DIAGNOSIS — Z13220 Encounter for screening for lipoid disorders: Secondary | ICD-10-CM | POA: Insufficient documentation

## 2023-08-15 DIAGNOSIS — F39 Unspecified mood [affective] disorder: Secondary | ICD-10-CM | POA: Insufficient documentation

## 2023-08-15 DIAGNOSIS — K219 Gastro-esophageal reflux disease without esophagitis: Secondary | ICD-10-CM | POA: Insufficient documentation

## 2023-08-15 DIAGNOSIS — M542 Cervicalgia: Secondary | ICD-10-CM | POA: Insufficient documentation

## 2023-08-15 LAB — LIPID PANEL
CHOL/HDL RATIO: 4.8
CHOLESTEROL: 196 mg/dL (ref 100–200)
HDL CHOL: 41 mg/dL — ABNORMAL LOW (ref >=50)
LDL CALC: 134 mg/dL — ABNORMAL HIGH (ref <100)
NON-HDL: 155 mg/dL (ref <=190)
TRIGLYCERIDES: 117 mg/dL (ref <150)
VLDL CALC: 21 mg/dL (ref <30)

## 2023-08-15 LAB — BASIC METABOLIC PANEL
ANION GAP: 8 mmol/L (ref 4–13)
BUN/CREA RATIO: 26 — ABNORMAL HIGH (ref 6–22)
BUN: 21 mg/dL (ref 8–25)
CALCIUM: 8.4 mg/dL — ABNORMAL LOW (ref 8.6–10.2)
CHLORIDE: 106 mmol/L (ref 96–111)
CO2 TOTAL: 22 mmol/L (ref 22–30)
CREATININE: 0.81 mg/dL (ref 0.60–1.05)
ESTIMATED GFR - FEMALE: 86 mL/min/BSA (ref >=60)
GLUCOSE: 112 mg/dL (ref 65–125)
POTASSIUM: 4 mmol/L (ref 3.5–5.1)
SODIUM: 136 mmol/L (ref 136–145)

## 2023-08-15 LAB — HGA1C (HEMOGLOBIN A1C WITH EST AVG GLUCOSE)
ESTIMATED AVERAGE GLUCOSE: 105 mg/dL
HEMOGLOBIN A1C: 5.3 % (ref 4.0–5.6)

## 2023-08-15 MED ORDER — PANTOPRAZOLE 40 MG TABLET,DELAYED RELEASE: 40.0000 mg | DELAYED_RELEASE_TABLET | Freq: Every day | ORAL | 3 refills | Status: DC

## 2023-08-15 MED ORDER — HYDROCHLOROTHIAZIDE 12.5 MG CAPSULE: 12.5000 mg | ORAL_CAPSULE | Freq: Every day | ORAL | 11 refills | Status: DC

## 2023-08-15 MED ORDER — CITALOPRAM 20 MG TABLET: 20.0000 mg | ORAL_TABLET | Freq: Every day | ORAL | 11 refills | Status: DC

## 2023-08-15 MED ORDER — BUPROPION HCL XL 150 MG 24 HR TABLET, EXTENDED RELEASE: 150.0000 mg | ORAL_TABLET | Freq: Every day | ORAL | 11 refills | Status: DC

## 2023-08-15 NOTE — Progress Notes (Signed)
 [facility]: Family Medicine Office Visit      Service date: 08/15/2023:     MRN: [id number]   Birth date: 1971/01/02  PCP: America Bake, APRN,FNP-BC    Sherry Matthews is a 53 y.o. female who presents to [facility] today with chief complaint of Back Pain    HPI     Presents with worsening chronic lower back and right shoulder pain.   Ongoing for Years with prior diagnosis of lumbar degenerative disease.   [facility] previously evaluated with MRI in 2018, PT, started on gabapentin . No surgeries. Shortly moved after completing PT and didn't follow up .   Getting worse at least past 2 months  Was trying to start walking more for exercise but make pain worse.    Pain cervical and right shoulder, Trouble wearing bra leaning forward , pinched nerve neck radiating right arm.  Lumbar pain constant with intermittent shooting stabbing pains down LLE to knee. Feels catching feeling with position changes. Sitting causes numbness to leg.   Pain occurring daily.  Doing home PT exercises with no relief.   Feeling frustrated that she is not able to exercise due to pain as she is trying to lose weight.     Regarding chronic medications, taking as prescribed. No concerns.   OSA on CPAP, following with sleep clinic.   HTN, denies chest pain, SOB, of headaches.   Mood stable.   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     See ROS for further details.  ASSESSMENT/PLAN       ICD-10-CM    1. Primary hypertension  I10 BASIC METABOLIC PANEL     hydroCHLOROthiazide  (MICROZIDE ) 12.5 mg Oral Capsule      2. Mood disorder (CMS HCC)  F39 buPROPion  (WELLBUTRIN  XL) 150 mg extended release 24 hr tablet     citalopram  (CELEXA ) 20 mg Oral Tablet      3. Prediabetes  R73.03 HGA1C (HEMOGLOBIN A1C WITH EST AVG GLUCOSE)      4. Gastroesophageal reflux disease, unspecified whether esophagitis present  K21.9 pantoprazole  (PROTONIX ) 40 mg Oral Tablet, Delayed Release (E.C.)      5. Chronic left-sided  lumbar radiculopathy  M54.16 Refer to [facility]      6. Breast cancer screening by mammogram  Z12.31 MAMMO BILATERAL SCREENING-ADDL VIEWS/BREAST US  AS REQ BY RAD      7. Screening, lipid  Z13.220 LIPID PANEL      8. Chronic neck pain  M54.2 Refer to [facility]    G89.29           1. Primary hypertension  Chronic, controlled. Continue medications   - BASIC METABOLIC PANEL; Future  - hydroCHLOROthiazide  (MICROZIDE ) 12.5 mg Oral Capsule; Take 1 Capsule (12.5 mg total) by mouth Daily  Dispense: 90 Capsule; Refill: 11    2. Mood disorder (CMS HCC)  Chronic, controlled. Continue medications   - buPROPion  (WELLBUTRIN  XL) 150 mg extended release 24 hr tablet; Take 1 Tablet (150 mg total) by mouth Daily  Dispense: 90 Tablet; Refill: 11  - citalopram  (CELEXA ) 20 mg Oral Tablet; Take 1 Tablet (20 mg total) by mouth Daily  Dispense: 90 Tablet; Refill: 11    3. Prediabetes  - HGA1C (HEMOGLOBIN A1C WITH EST AVG GLUCOSE); Future    4. Gastroesophageal reflux disease, unspecified whether esophagitis present  Chronic, controlled. Continue medications   - pantoprazole  (PROTONIX ) 40 mg Oral Tablet, Delayed Release (E.C.); Take 1 Tablet (40 mg total) by  mouth Daily  Dispense: 90 Tablet; Refill: 3    5. Chronic left-sided lumbar radiculopathy  Chronic, uncontrolled / worsening.   Past MRI reviewed in care everywhere.   - Refer to [facility]; Future for further evaluation and management     6. Breast cancer screening by mammogram  - MAMMO BILATERAL SCREENING-ADDL VIEWS/BREAST US  AS REQ BY RAD; Future    7. Screening, lipid  - LIPID PANEL; Future    8. Chronic neck pain  Chronic, worsening.   Prior workup and treatment completed at external facility.   - Refer to [facility]; Future        Follow up on or around: 02/15/2024. Reason for Follow Up: chronic medication check        The patient was given ample opportunity to ask questions and those questions were answered to the patient's satisfaction. The patient  was encouraged to be involved in their own care, and all diagnoses, medications, and medication side-effects were discussed. The patient was told to contact me with any additional questions or concerns, and to go to the emergency department in an emergency.     ROS   Review of Systems:  Review of Systems   Respiratory:  Negative for shortness of breath.    Cardiovascular:  Negative for chest pain and palpitations.   Gastrointestinal: Negative.  Negative for nausea and vomiting.   Musculoskeletal:  Positive for arthralgias, back pain, myalgias and neck pain. Negative for neck stiffness.     Pertinent discussed in HPI, otherwise all other systems negative.   MEDICAL HISTORY   Medical History:  Problem list, PMHx, PSHx, FHx, SHx, allergies, and current Rx were reviewed and updated as appropriate.  Past Medical History:   Diagnosis Date    Allergic rhinitis     Asthma     uses inhalers when allergies are bad    BMI 45.0-49.9, adult (CMS HCC)     Bullous pemphigoid (CMS HCC)     Chronic left-sided lumbar radiculopathy 11/30/2020    CPAP (continuous positive airway pressure) dependence     presuure 10 per patient    Esophageal reflux     Hypertension     due to Contrave     Mood disorder (CMS HCC)     Neck problem     harder turning head to the left    OSA on CPAP 11/30/2020    Sleep apnea       Outpatient Medications Marked as Taking for the 08/15/23 encounter (Office Visit) with America Bake, APRN,FNP-BC   Medication Sig    azelastine  (ASTELIN ) 137 mcg (0.1 %) Nasal Spray, Non-Aerosol SPRAY 2 SPRAYS INTO EACH NOSTRIL TWICE A DAY AS DIRECTED    buPROPion  (WELLBUTRIN  XL) 150 mg extended release 24 hr tablet Take 1 Tablet (150 mg total) by mouth Daily    citalopram  (CELEXA ) 20 mg Oral Tablet Take 1 Tablet (20 mg total) by mouth Daily    fluticasone  propionate (FLONASE ) 50 mcg/actuation Nasal Spray, Suspension SPRAY 1 SPRAY INTO EACH NOSTRIL EVERY DAY    gabapentin  (NEURONTIN ) 300 mg Oral Capsule TAKE 1 CAPSULE BY MOUTH  EVERY NIGHT.    hydroCHLOROthiazide  (MICROZIDE ) 12.5 mg Oral Capsule Take 1 Capsule (12.5 mg total) by mouth Daily    minocycline (MINOCIN) 100 mg Oral Capsule Take 1 Capsule (100 mg total) by mouth Twice daily    Niacinamide (NICOMIDE-T) 500 mg Oral Tablet Take 1 Tablet (500 mg total) by mouth Daily    pantoprazole  (PROTONIX ) 40  mg Oral Tablet, Delayed Release (E.C.) Take 1 Tablet (40 mg total) by mouth Daily    silver sulfADIAZINE (SILVADENE) 1 % Cream Apply topically Twice daily    SYMBICORT  160-4.5 mcg/actuation Inhalation oral inhaler INHALE 2 PUFFS BY MOUTH TWICE A DAY     OBJECTIVE   OBJECTIVE    BP 134/88 (Site: Left Wrist, Patient Position: Sitting, Cuff Size: Adult)   Pulse (!) 114   Temp 36.7 C (98 F) (Temporal)   Ht 1.575 m (5\' 2" )   Wt 128 kg (282 lb 6.6 oz)   SpO2 97%   BMI 51.65 kg/m     No LMP recorded. (Menstrual status: Irregular cycle).Body mass index is 51.65 kg/m.  Weight/BMI:  Wt Readings from Last 5 Encounters:   08/15/23 128 kg (282 lb 6.6 oz)   03/14/23 123 kg (271 lb 9.7 oz)   01/23/23 124 kg (273 lb 5.9 oz)   09/05/22 134 kg (294 lb 15.6 oz)   07/02/22 129 kg (284 lb 2.8 oz)      Physical Exam  Vitals reviewed.   Constitutional:       General: She is not in acute distress.  Cardiovascular:      Rate and Rhythm: Normal rate and regular rhythm.      Heart sounds: Normal heart sounds. No murmur heard.  Pulmonary:      Effort: Pulmonary effort is normal. No respiratory distress.      Breath sounds: Normal breath sounds.   Musculoskeletal:      Right shoulder: Decreased range of motion. Normal pulse.      Left shoulder: Normal pulse.      Cervical back: Tenderness present. No bony tenderness. No pain with movement. Decreased range of motion.      Thoracic back: Normal.      Lumbar back: Tenderness present. No deformity or signs of trauma. Decreased range of motion.   Neurological:      General: No focal deficit present.      Mental Status: She is alert and oriented to person, place,  and time.         Laboratory Studies/Data review:  Labs ordered today, pending.     Patient seen independently.   America Bake, APRN,FNP-BC  08/15/2023, 07:39  America Bake, APRN,FNP-BC  FAMILY [facility]ICINE, [facility]  Operated by [facility]  [address]  [place] [address]  Dept: [phone]  Dept Fax: [phone]    This document was generated using a voice recognition system and transcription. All documents are proofed as best as possible, but it may have misspelled words, incorrect words, or syntax and grammatical errors because of the imperfect nature of the system.

## 2023-08-26 ENCOUNTER — Ambulatory Visit: Payer: Self-pay | Attending: ANESTHESIOLOGY | Admitting: ANESTHESIOLOGY

## 2023-08-26 ENCOUNTER — Other Ambulatory Visit: Payer: Self-pay

## 2023-08-26 ENCOUNTER — Other Ambulatory Visit (INDEPENDENT_AMBULATORY_CARE_PROVIDER_SITE_OTHER): Payer: Self-pay | Admitting: ANESTHESIOLOGY

## 2023-08-26 ENCOUNTER — Ambulatory Visit (INDEPENDENT_AMBULATORY_CARE_PROVIDER_SITE_OTHER)

## 2023-08-26 ENCOUNTER — Encounter (INDEPENDENT_AMBULATORY_CARE_PROVIDER_SITE_OTHER): Payer: Self-pay | Admitting: ANESTHESIOLOGY

## 2023-08-26 VITALS — BP 127/73 | HR 88 | Temp 97.2°F | Ht 62.0 in | Wt 282.2 lb

## 2023-08-26 DIAGNOSIS — Z6841 Body Mass Index (BMI) 40.0 and over, adult: Secondary | ICD-10-CM | POA: Insufficient documentation

## 2023-08-26 DIAGNOSIS — M5416 Radiculopathy, lumbar region: Secondary | ICD-10-CM | POA: Insufficient documentation

## 2023-08-26 DIAGNOSIS — M545 Low back pain, unspecified: Secondary | ICD-10-CM | POA: Insufficient documentation

## 2023-08-26 DIAGNOSIS — M542 Cervicalgia: Secondary | ICD-10-CM | POA: Insufficient documentation

## 2023-08-26 DIAGNOSIS — G8929 Other chronic pain: Secondary | ICD-10-CM | POA: Insufficient documentation

## 2023-08-26 NOTE — Progress Notes (Signed)
 Pain Management/Spine    Chief Complaint:  Chief Complaint   Patient presents with    Low Back Pain       History of Present Illness:  Sherry Matthews is a 53 y.o. female  Location:  low back pain with LLE pain   Radiation:  LLE in S1 distribution into popliteal fossa  Character: sharp dull achy      Onset:  20-30 years and has been worsening since gaining weight  Aggravating:  sitting to standing, forward flexion  Alleviating:  elevating L LLE  Weakness/Sensation Changes/Incontinence: denies   Treatments received: Tylenol/ motrin/ gabapentin   Functional Impairments:  reports gaining more weight because she is unable to stand to cook. Unable to ambulate for longer to 2-3 minutes 2/2 low back pain   Additional Info:      Past Medical History:  Past Medical History:   Diagnosis Date    Allergic rhinitis     Asthma     uses inhalers when allergies are bad    BMI 45.0-49.9, adult (CMS HCC)     Bullous pemphigoid (CMS HCC)     Chronic left-sided lumbar radiculopathy 11/30/2020    CPAP (continuous positive airway pressure) dependence     presuure 10 per patient    Esophageal reflux     Hypertension     due to Contrave     Mood disorder (CMS HCC)     Neck problem     harder turning head to the left    OSA on CPAP 11/30/2020    Sleep apnea        Past Surgical History:  Past Surgical History:   Procedure Laterality Date    ADENOIDECTOMY      GASTROSCOPY      HX BUNIONECTOMY Right     HX CARPAL TUNNEL RELEASE Bilateral     HX CHOLECYSTECTOMY      HX COLONOSCOPY      HX WISDOM TEETH EXTRACTION      LEG SURGERY Right 2017    tib fib HW, s/p fall off ladder           Social History:  Social History     Socioeconomic History    Marital status: Single     Spouse name: Not on file    Number of children: Not on file    Years of education: Not on file    Highest education level: Not on file   Occupational History    Not on file   Tobacco Use    Smoking status: Never    Smokeless tobacco: Never   Vaping Use    Vaping status: Never Used    Substance and Sexual Activity    Alcohol use: Not Currently    Drug use: Never    Sexual activity: Not on file   Other Topics Concern    Ability to Walk 1 Flight of Steps without SOB/CP Yes    Routine Exercise Not Asked    Ability to Walk 2 Flight of Steps without SOB/CP Yes    Unable to Ambulate Not Asked    Total Care Not Asked    Ability To Do Own ADL's Yes    Uses Walker Not Asked    Other Activity Level Not Asked    Uses Cane Not Asked   Social History Narrative    Not on file     Social Determinants of Health     Financial Resource Strain: Not on file   Transportation Needs: Not  on file   Social Connections: Not on file   Intimate Partner Violence: Not on file   Housing Stability: Not on file       Family History:  Family Medical History:       Problem Relation (Age of Onset)    Alcohol abuse Mother, Father    Cancer Mother    Cirrhosis Mother    Lung Cancer Maternal Grandfather    Obesity Mother, Father    Ovarian Cancer Mother              Medications:    Current Outpatient Medications:     azelastine  (ASTELIN ) 137 mcg (0.1 %) Nasal Spray, Non-Aerosol, SPRAY 2 SPRAYS INTO EACH NOSTRIL TWICE A DAY AS DIRECTED, Disp: 30 mL, Rfl: 3    buPROPion  (WELLBUTRIN  XL) 150 mg extended release 24 hr tablet, Take 1 Tablet (150 mg total) by mouth Daily, Disp: 90 Tablet, Rfl: 11    citalopram  (CELEXA ) 20 mg Oral Tablet, Take 1 Tablet (20 mg total) by mouth Daily, Disp: 90 Tablet, Rfl: 11    fluticasone  propionate (FLONASE ) 50 mcg/actuation Nasal Spray, Suspension, SPRAY 1 SPRAY INTO EACH NOSTRIL EVERY DAY, Disp: 48 g, Rfl: 1    gabapentin  (NEURONTIN ) 300 mg Oral Capsule, TAKE 1 CAPSULE BY MOUTH EVERY NIGHT., Disp: 90 Capsule, Rfl: 2    hydroCHLOROthiazide  (MICROZIDE ) 12.5 mg Oral Capsule, Take 1 Capsule (12.5 mg total) by mouth Daily, Disp: 90 Capsule, Rfl: 11    minocycline (MINOCIN) 100 mg Oral Capsule, Take 1 Capsule (100 mg total) by mouth Twice daily, Disp: , Rfl:     Niacinamide (NICOMIDE-T) 500 mg Oral Tablet, Take  1 Tablet (500 mg total) by mouth Daily, Disp: , Rfl:     pantoprazole  (PROTONIX ) 40 mg Oral Tablet, Delayed Release (E.C.), Take 1 Tablet (40 mg total) by mouth Daily, Disp: 90 Tablet, Rfl: 3    silver sulfADIAZINE (SILVADENE) 1 % Cream, Apply topically Twice daily, Disp: , Rfl:     SYMBICORT  160-4.5 mcg/actuation Inhalation oral inhaler, INHALE 2 PUFFS BY MOUTH TWICE A DAY, Disp: 30.6 Each, Rfl: 6    Review of Systems:  Denies constipation or sedation  All other systems negative except for any noted in the HPI    Physical Exam:  Vitals:    08/26/23 0753   BP: 127/73   Pulse: 88   Temp: 36.2 C (97.2 F)   Weight: 128 kg (282 lb 3 oz)   Height: 1.575 m (5\' 2" )   BMI: 51.61         Body mass index is 51.61 kg/m.  Appears stated age  Speech clear  Affect appropriate  Alert  No scleral icterus  Mmm  Adequate peripheral perfusion  Unlabored respirations  No abdominal distention  No edema  No rash    MSK:  Lumbar   ROM:  Limited flexion, Limited extension, and Limited rotation   Palpation: Tender lower lumbar   Motor: 5/5 in LEs b/l   Sensory: Intact in LEs b/l   Facet loading: Positive bilaterally    Facet TTP: Positive bilaterally   Trigger points: Lumbar paraspinal bilateral   Straight leg raise: Positive LLE      SI Joint   Palpation: Positive bilaterally   Provocation Test :      [place] (-)        Imaging:  Reviewed.  11/2016 L/S MRI  T12-L1: Disc desiccation is identified with mild disc bulging. Mild reactive changes are seen within the adjacent  endplates. No central stenosis or exiting nerve root impingement is seen.     L1-L2: Negative.     L2-L3: Disc desiccation is identified, and there may be a tiny right paracentral disc extrusion which minimally extends up the posterior margin of L2. This minimally indents the ventral thecal sac without traversing nerve root impingement or pathologic   narrowing of the thecal sac. No exiting nerve root impingement is seen.     L3-L4: Negative.     L4-L5: Disc desiccation is  combined with moderate bilateral facet and ligamentum flavum hypertrophy. No central stenosis or exiting nerve root impingement is seen.     L5-S1: Disc desiccation is identified with disc bulging and reactive changes in the adjacent endplates. There is a superimposed broad-based right paracentral disc protrusion which produces mild asymmetric mass effect upon the traversing right S1 nerve   root. Mild right and moderate left facet and ligamentum flavum hypertrophy is present. The thecal sac is not pathologically narrowed. There is contacting of both exiting L5 nerve roots laterally by disc and osteophyte.     Outside Records:  Reviewed.    Assessment:    ICD-10-CM    1. Chronic left-sided lumbar radiculopathy  M54.16 XR LUMBOSACRAL SERIES W FLEX EXT      2. Chronic neck pain  M54.2 XR LUMBOSACRAL SERIES W FLEX EXT    G89.29       3. Low back pain  M54.50 XR LUMBOSACRAL SERIES W FLEX EXT      4. Chronic lumbar radiculopathy  M54.16 XR LUMBOSACRAL SERIES W FLEX EXT           Plan:  53 yo F with chronic low back and LLE pain. No red flags. Reports 20-30 year history of LBP with LLE pain with forward flexion. She states her condition has worsened since gaining weight, she is unable to stand to cook 2/2 pain as well as ambulate longer than 2-3 minutes. Lumbar Ap-Lat today with flex/ex- grade 1 spondylolithesis L5/S1 with extension.  Will start with PT Rx. Follow up in 5 weeks to discuss LMBB vs. S1 TFESI vs. L5/S1 ILESI. Will send consult for dietician.     Lenoard Rad, MD

## 2023-09-02 ENCOUNTER — Other Ambulatory Visit (INDEPENDENT_AMBULATORY_CARE_PROVIDER_SITE_OTHER): Payer: Self-pay | Admitting: Otolaryngology

## 2023-09-02 NOTE — Telephone Encounter (Signed)
 Last office visit 05/2022. Needs visit for refill.

## 2023-09-12 ENCOUNTER — Ambulatory Visit (HOSPITAL_BASED_OUTPATIENT_CLINIC_OR_DEPARTMENT_OTHER): Payer: Self-pay | Admitting: Registered Nurse

## 2023-09-17 ENCOUNTER — Ambulatory Visit (INDEPENDENT_AMBULATORY_CARE_PROVIDER_SITE_OTHER): Payer: Self-pay | Admitting: Registered"

## 2023-09-17 ENCOUNTER — Other Ambulatory Visit: Payer: Self-pay

## 2023-09-17 VITALS — Ht 62.0 in | Wt 284.8 lb

## 2023-09-17 DIAGNOSIS — M542 Cervicalgia: Secondary | ICD-10-CM

## 2023-09-17 DIAGNOSIS — M5416 Radiculopathy, lumbar region: Secondary | ICD-10-CM

## 2023-09-17 DIAGNOSIS — M545 Low back pain, unspecified: Secondary | ICD-10-CM

## 2023-09-17 DIAGNOSIS — E669 Obesity, unspecified: Secondary | ICD-10-CM

## 2023-09-17 DIAGNOSIS — Z6841 Body Mass Index (BMI) 40.0 and over, adult: Secondary | ICD-10-CM

## 2023-09-17 DIAGNOSIS — G4733 Obstructive sleep apnea (adult) (pediatric): Secondary | ICD-10-CM

## 2023-09-17 DIAGNOSIS — I10 Essential (primary) hypertension: Secondary | ICD-10-CM

## 2023-09-17 NOTE — Patient Instructions (Addendum)
 Medical Nutrition Therapy    Recommendations    Focus on high fiber and high protein.      Follow Healthy Eating Plate guidelines:  1/2 plate non-starchy vegetables, 1/4 plate whole grains/complex carbohydrate, 1/4 plate lean protein, 1 serving healthy fat    --Whole grains:  Oats, quinoa, barley, brown/red/black rice, whole grain pasta or bread   Other complex carbohydrates include: beans, lentils, all veggies, sweet potatoes, fresh whole fruit    --Lean proteins:  Beans, lentils, nuts, fish, chicken, [place]; occasionally lean pork or beef (once a week or less)    --Healthy fats:  Nuts, seeds, nut butters, avocado, extra virgin olive oil, hummus, fatty fish (wild caught salmon, trout, tuna, halibut, herring, sardines, cod)    --Goal:  One serving fruit at breakfast, 1/2 plate non-starchy vegetables at lunch and dinner    Measure your snack foods.  Remember you can have more than one serving but you want to be aware of how much you are intaking.    Have protein with every meal and snack.    Aim for 30 grams of protein per meal.    Limit carbohydrates to 30-45 grams per meal.    Use nutrient dense convenience foods like steamable frozen vegetables or microwave grain pouches or pre-cooked meat (canned tuna).    Follow-up in 4 weeks.     Raeann Buhl, RDLD  09/17/2023, 09:13

## 2023-09-17 NOTE — Progress Notes (Signed)
 PAIN MANAGEMENT  [facility]  [address]  [place], [place] [address]  [phone]    Medical Nutrition Therapy    Sherry Matthews  09/17/2023  [id number]      I had the pleasure of meeting 53 y.o. Sherry Matthews today for nutrition counseling.  The reason for today's visit was Weight Management.  PMH includes primary HTN and OSA with CPAP.  Referred by Dr. Barclay Leyden.  Asked for referral to discuss weight loss.  Has seen dietitian before.      States she has a lot of food allergies and it makes things complicated.  Allergies: peanuts, shellfish, lima beans, carrots, whey and an unknown sixth allergy.  Doesn't do whey protein powder but eats foods that contain whey in small portions.  Has an intolerance to eggs which causes stuffy nose and sleepiness.  Has not had eggs tested however.  Eats eggs as ingredient.      Previous weight loss attempts: Weight Watchers, Atkins, cabbage, limiting carbohydrates and sugar, and reduced portions.  Also tried Contrave  medication.  States when she was younger going to the gym while limiting bread and eating more vegetables and fruits.  Was able to lose on fad diets but quickly gained weight back.  Wt hx: has been around current weight since 2015.  Biggest barrier to weight loss is inability to stand to cook.  Her son often prepares main meal.  Son also does the grocery shopping.  Reports emotional eating (ice cream) 3-4x/week.  Reports mindless eating when not hungry.  Currently on Wellbutrin .       Latest Reference Range & Units 08/15/23 08:21   CHOLESTEROL 100 - 200 mg/dL 086   HDL-CHOLESTEROL >=57 mg/dL 41 (L)   LDL (CALCULATED) <100 mg/dL 846 (H)   TRIGLYCERIDES <150 mg/dL 962   VLDL (CALCULATED) <30 mg/dL 21   CHOL/HDL RATIO  4.8   NON - HDL (CALCULATED) <=190 mg/dL 952   (L): Data is abnormally low  (H): Data is abnormally high     Latest Reference Range & Units 08/15/23 08:21   HEMOGLOBIN A1C 4.0 - 5.6 % 5.3         Ht  1.575 m (5\' 2" )   Wt 129 kg (284 lb 13.4 oz)   BMI 52.10 kg/m       IBW: 50 kg        %IBW: 258%        AdjBW: 69.75 kg    Estimated calorie needs: 1750 kcals (25  kcal/kg AdjBW)       A 24 hour food recall/food frequency was performed and the results are as follows:    Breakfast - shake (1 scoop Garden of Life Raw Organic Meal Replacement protein powder (20g protein, 13g cho), frozen mixed berries, water )  Snacks - throughout the day at desk - often just meat by itself or chicken with rice and ginger and soy sauce or sweet potato with Splenda Brown Sugar, beef and asparagus or cauliflower spinach pizza frozen spinach quiche or frozen meal  Lunch - throughout the day at desk - often just meat by itself or chicken with rice and ginger and soy sauce or sweet potato with Splenda Brown Sugar, beef and asparagus or cauliflower spinach pizza or frozen spinach quiche or frozen meal  Snacks - 1 cup Cheez-its (600 calories, 68 cho) or string cheese  Dinner - at least 3-4x/week 2 cups ice cream (510 calories, 60g cho)  Snacks - none  Goes to bed 10-11p  Beverages - water , 2x/month 7Up    Restaurant/Take-out meal frequency - 2x/month    Sherry Matthews typically engages in 0 minutes of physical activity per week.      Recommendations:   Patient Instructions   Medical Nutrition Therapy    Recommendations    Focus on high fiber and high protein.      Follow Healthy Eating Plate guidelines:  1/2 plate non-starchy vegetables, 1/4 plate whole grains/complex carbohydrate, 1/4 plate lean protein, 1 serving healthy fat    --Whole grains:  Oats, quinoa, barley, brown/red/black rice, whole grain pasta or bread   Other complex carbohydrates include: beans, lentils, all veggies, sweet potatoes, fresh whole fruit    --Lean proteins:  Beans, lentils, nuts, fish, chicken, [place]; occasionally lean pork or beef (once a week or less)    --Healthy fats:  Nuts, seeds, nut butters, avocado, extra virgin olive oil, hummus, fatty fish (wild  caught salmon, trout, tuna, halibut, herring, sardines, cod)    --Goal:  One serving fruit at breakfast, 1/2 plate non-starchy vegetables at lunch and dinner    Measure your snack foods.  Remember you can have more than one serving but you want to be aware of how much you are intaking.    Have protein with every meal and snack.    Aim for 30 grams of protein per meal.    Limit carbohydrates to 30-45 grams per meal.    Use nutrient dense convenience foods like steamable frozen vegetables or microwave grain pouches or pre-cooked meat (canned tuna).    Follow-up in 4 weeks.     Raeann Buhl, RDLD  09/17/2023, 09:13      Nutrition Diagnosis: Food and nutrition related knowledge deficit related to limited previous nutrition education as evidenced by 24hr food recall and referral for education.    Time spent with patient:  60 minutes    I will be happy to follow-up with this patient.     Return in 4 weeks (on 10/15/2023) for Video Visit.    Thank you for the referral.      Raeann Buhl, RD LD  Outpatient Dietitian  [facility]

## 2023-09-26 ENCOUNTER — Ambulatory Visit: Payer: Self-pay | Attending: Internal Medicine | Admitting: Nurse Practitioner

## 2023-09-26 ENCOUNTER — Encounter (HOSPITAL_BASED_OUTPATIENT_CLINIC_OR_DEPARTMENT_OTHER): Payer: Self-pay | Admitting: Nurse Practitioner

## 2023-09-26 ENCOUNTER — Other Ambulatory Visit: Payer: Self-pay

## 2023-09-26 VITALS — BP 135/87 | HR 82 | Temp 98.2°F | Resp 18 | Ht 62.0 in | Wt 289.9 lb

## 2023-09-26 DIAGNOSIS — G4719 Other hypersomnia: Secondary | ICD-10-CM | POA: Insufficient documentation

## 2023-09-26 DIAGNOSIS — G4733 Obstructive sleep apnea (adult) (pediatric): Secondary | ICD-10-CM | POA: Insufficient documentation

## 2023-09-26 NOTE — Progress Notes (Signed)
 [facility]  [facility] [place]    Patient Name: Sherry Matthews  MRN#: [id number]  DOB: [birth year]  Date of Service: 09/26/2023    CC: Sleep Apnea        Last Clinic Visit: 01/23/2023 with Dr. Cosette Dinning and Fellow      Sleep Disorders Current Therapy  Positive Pressure:  CPAP 10    Mask: N20- medium      HPI: The patient 53 y.o. with obstructive sleep apnea presenting for routine follow up after completing updated HST and set up with new CPAP. She states she has finally settled out on N20 type of style mask- she purchased and off brand on [employer] as she was trying different options. She likes the N20 very well. She states she does perceives benefit from CPAP device. She reports difficulty sleeping is partially due to pain and partially due to not sure why. She states she is seeing a specialist for back pain. She denies history of CHF, stroke, seizure, head injury, alcohol use. She is taking gabapentin  for pain.         Apnea Hypopnea index (diagnostic): HST 05/08/2023- AHI 67.4 events/hour; central events recorded on this study        Problems with Therapy:    Mask: N/A    Machine: Denies    Nasal: Denies    Epworth Sleepiness Scale: 15        Sleep structure:   - bedtime: 2100 laying down  - sleep latency: puts mask on 2230-2300; estimates latency to be 30-60 minutes  - Frequent night time awakening: Yes she does wakes during the night at least 2 times- some of which is due to pain and other times it just feels spontaneous to her  -Up: 0630  -Nap: on weekends sometimes  - excessive caffeine, soda: some days none  -Alcohol: Denies         Associated diseases: Obesity, back pain, asthma, allergies, HTN        Review of Systems:   Constitutional: (-) fever (-) chills (-) fatigue (-) unintentional weight loss  Eyes: (-) vision changes (-) itchy or burning eyes  Ears/Nose/Thoat/Mouth: (-) snoring  (-) oral lesions  (-) sore throat (-) nasal congestion  CV: (-) chest pain (-) palpitations (-)  edema   Pulm: (-) shortness of breath (-) cough (-) nocturnal gasping  (-) dyspnea on exertion  GI: (-) abl pain (-) nausea (-) vomiting (-) diarrhea (-) constipation  Genitourinary: (-) dysuria (-) nocturia (-) incontinence (-) urgency  Musculoskeletal: (-) joint pain (-) joint swelling (-) muscle pain  Neurologic: (-) memory loss (-) paresthesias (+) drowsiness (-) headaches  Skin: (-) rash (-) itching (-) jaundice  Psych: (-) anxiety (-) depression (-) insomnia   Endocrine: (-) heat intolerance (-) cold intolerance  Heme/Lymph: (-) lymphadenopathy (-) easy bruising  Allergy/Immune: (-) hives (-) seasonal allergies         Past Medical History:   Past Medical History:   Diagnosis Date    Allergic rhinitis     Asthma     uses inhalers when allergies are bad    BMI 45.0-49.9, adult (CMS HCC)     Bullous pemphigoid (CMS HCC)     Chronic left-sided lumbar radiculopathy 11/30/2020    CPAP (continuous positive airway pressure) dependence     presuure 10 per patient    Esophageal reflux     Hypertension     due to Contrave     Mood disorder (CMS  HCC)     Neck problem     harder turning head to the left    OSA on CPAP 11/30/2020    Sleep apnea            Allergies:   Allergies   Allergen Reactions    Latex Rash    Carrot Rash    Eggs     Peanut     Shellfish Derived     Whey     Lima Bean Hives/ Urticaria           Current Medications:   Prior to Admission medications    Medication Sig Start Date End Date Taking? Authorizing Provider   azelastine  (ASTELIN ) 137 mcg (0.1 %) Nasal Spray, Non-Aerosol SPRAY 2 SPRAYS INTO EACH NOSTRIL TWICE A DAY AS DIRECTED 04/01/23   Chari Como, MD   buPROPion  (WELLBUTRIN  XL) 150 mg extended release 24 hr tablet Take 1 Tablet (150 mg total) by mouth Daily 08/15/23   America Bake, APRN,FNP-BC   citalopram  (CELEXA ) 20 mg Oral Tablet Take 1 Tablet (20 mg total) by mouth Daily 08/15/23   America Bake, APRN,FNP-BC   fluticasone  propionate (FLONASE ) 50 mcg/actuation Nasal Spray, Suspension  SPRAY 1 SPRAY INTO EACH NOSTRIL EVERY DAY 10/31/22   Armeni, Ma Saupe, MD   gabapentin  (NEURONTIN ) 300 mg Oral Capsule TAKE 1 CAPSULE BY MOUTH EVERY NIGHT. 07/10/23   America Bake, APRN,FNP-BC   hydroCHLOROthiazide  (MICROZIDE ) 12.5 mg Oral Capsule Take 1 Capsule (12.5 mg total) by mouth Daily 08/15/23   America Bake, APRN,FNP-BC   minocycline (MINOCIN) 100 mg Oral Capsule Take 1 Capsule (100 mg total) by mouth Twice daily 08/11/21   Provider, Historical   Niacinamide (NICOMIDE-T) 500 mg Oral Tablet Take 1 Tablet (500 mg total) by mouth Daily 02/05/21   Provider, Historical   pantoprazole  (PROTONIX ) 40 mg Oral Tablet, Delayed Release (E.C.) Take 1 Tablet (40 mg total) by mouth Daily 08/15/23   America Bake, APRN,FNP-BC   silver sulfADIAZINE (SILVADENE) 1 % Cream Apply topically Twice daily    Provider, Historical   SYMBICORT  160-4.5 mcg/actuation Inhalation oral inhaler INHALE 2 PUFFS BY MOUTH TWICE A DAY 03/22/22   America Bake, APRN,FNP-BC         Physical Exam: The patient's current vitals are BP 135/87   Pulse 82   Temp 36.8 C (98.2 F)   Resp 18   Ht 1.575 m (5\' 2" )   Wt 132 kg (289 lb 14.5 oz)   SpO2 100%   BMI 53.02 kg/m       Constitutional: NAD, pleasant, appears stated age  Eyes: Conjunctiva clear, no drainage noted.   Musculoskeletal: Moving all extremities, No observed joint swelling or erythema  Skin: Skin warm and dry without discoloration  Psychiatric: Patient is alert, displaying appropriate behavior and speech       PAP Compliance Report Data Review: 08/26/2023 - 09/24/2023  DME: [facility]  Machine Model: Resmed Airsense 11; set up 07/10/2023  Setting: CPAP 10 cm of H2O  Average usage (days): 6 hours 32 minutes  % of sleep >=4 hours use: 87%  Avg air leak time: 34.3  AHI: 19.1      ASSESSMENT/ PLAN:      1. Obstructive Sleep Apnea  2. Excessive Daytime Sleepiness  *Good response and benefit of therapy  *Apnea improved but not at goal. This may be partially due to mask fit- she has settled  with N20 at this point. Best recommendation is to request titration study to  ascertain adequate pressure settings which she is reluctant to do. Instead changed settings on machine to APAP 10-13  *Advised to avoid daily benadryl due to concerns for side effects long term with memory/cognitive health  *Continue to do well on positive airway pressure therapy with good subjective and objective compliance.  *Advise obtain replacement equipment every three to six months.   *Advised patient do not drive or operate heavy equipment or machinery if fatigued, drowsy or sleepy due to risk of injury.  *Pt informed comorbid conditions may worsen if sleep apnea is not treated adequately.    *Reviewed manufacturer recommended methods for machine and supply maintenance       RTC: 1 month follow up or prior if patient has issues or problems.      Lorali Khamis D Ireland Virrueta, APRN,NP-C 09/26/23 10:55      Joelly Bolanos D. Rockne Chyle, APRN, FNP-C  [place]  [facility]  Department of Medicine  Section of Pulmonary and Sleep Medicine

## 2023-09-30 ENCOUNTER — Ambulatory Visit: Payer: Self-pay | Attending: ANESTHESIOLOGY | Admitting: ANESTHESIOLOGY

## 2023-09-30 ENCOUNTER — Other Ambulatory Visit: Payer: Self-pay

## 2023-09-30 DIAGNOSIS — G8929 Other chronic pain: Secondary | ICD-10-CM

## 2023-09-30 DIAGNOSIS — R200 Anesthesia of skin: Secondary | ICD-10-CM

## 2023-09-30 DIAGNOSIS — R202 Paresthesia of skin: Secondary | ICD-10-CM

## 2023-09-30 DIAGNOSIS — M5416 Radiculopathy, lumbar region: Secondary | ICD-10-CM | POA: Insufficient documentation

## 2023-09-30 DIAGNOSIS — Z6841 Body Mass Index (BMI) 40.0 and over, adult: Secondary | ICD-10-CM | POA: Insufficient documentation

## 2023-09-30 DIAGNOSIS — M4726 Other spondylosis with radiculopathy, lumbar region: Secondary | ICD-10-CM

## 2023-09-30 DIAGNOSIS — M545 Low back pain, unspecified: Secondary | ICD-10-CM | POA: Insufficient documentation

## 2023-09-30 MED ORDER — GABAPENTIN 300 MG CAPSULE
300.0000 mg | ORAL_CAPSULE | Freq: Three times a day (TID) | ORAL | 0 refills | Status: DC
Start: 2023-09-30 — End: 2023-12-15

## 2023-09-30 NOTE — Progress Notes (Signed)
 Pain Management/Spine    Chief Complaint:  Chief Complaint   Patient presents with    Low Back Pain     F/u low back pain     Follow up:  She reports PT has only helped mildly but her pain has moved and worsened, now has numbness and tingling in bilateral LE. No bowel and bladder incontinence. She does reports her LBP has caused her more issues recently.   Dietician has helped her with figuring out her diet and with coping strategies.    History of Present Illness:  Sherry Matthews is a 53 y.o. female  Location:  low back pain with LLE pain   Radiation:  LLE in S1 distribution into popliteal fossa  Character: sharp dull achy      Onset:  20-30 years and has been worsening since gaining weight  Aggravating:  sitting to standing, forward flexion  Alleviating:  elevating L LLE  Weakness/Sensation Changes/Incontinence: denies   Treatments received: Tylenol/ motrin/ gabapentin   Functional Impairments:  reports gaining more weight because she is unable to stand to cook. Unable to ambulate for longer to 2-3 minutes 2/2 low back pain   Additional Info:      Past Medical History:  Past Medical History:   Diagnosis Date    Allergic rhinitis     Asthma     uses inhalers when allergies are bad    BMI 45.0-49.9, adult (CMS HCC)     Bullous pemphigoid (CMS HCC)     Chronic left-sided lumbar radiculopathy 11/30/2020    CPAP (continuous positive airway pressure) dependence     AutoCPAP 10-13 cm H2O    Esophageal reflux     Hypertension     due to Contrave     Mood disorder (CMS HCC)     Neck problem     harder turning head to the left    OSA on CPAP 11/30/2020    Sleep apnea        Past Surgical History:  Past Surgical History:   Procedure Laterality Date    ADENOIDECTOMY      GASTROSCOPY      HX BUNIONECTOMY Right     HX CARPAL TUNNEL RELEASE Bilateral     HX CHOLECYSTECTOMY      HX COLONOSCOPY      HX WISDOM TEETH EXTRACTION      LEG SURGERY Right 2017    tib fib HW, s/p fall off ladder           Social History:  Social History      Socioeconomic History    Marital status: Single     Spouse name: Not on file    Number of children: Not on file    Years of education: Not on file    Highest education level: Not on file   Occupational History    Not on file   Tobacco Use    Smoking status: Never    Smokeless tobacco: Never   Vaping Use    Vaping status: Never Used   Substance and Sexual Activity    Alcohol use: Not Currently    Drug use: Never    Sexual activity: Not on file   Other Topics Concern    Ability to Walk 1 Flight of Steps without SOB/CP Yes    Routine Exercise Not Asked    Ability to Walk 2 Flight of Steps without SOB/CP Yes    Unable to Ambulate Not Asked    Total Care Not Asked  Ability To Do Own ADL's Yes    Uses Walker Not Asked    Other Activity Level Not Asked    Uses Cane Not Asked   Social History Narrative    Not on file     Social Determinants of Health     Financial Resource Strain: Not on file   Transportation Needs: Not on file   Social Connections: Not on file   Intimate Partner Violence: Not on file   Housing Stability: Not on file       Family History:  Family Medical History:       Problem Relation (Age of Onset)    Alcohol abuse Mother, Father    Cancer Mother    Cirrhosis Mother    Lung Cancer Maternal Grandfather    Obesity Mother, Father    Ovarian Cancer Mother              Medications:    Current Outpatient Medications:     azelastine  (ASTELIN ) 137 mcg (0.1 %) Nasal Spray, Non-Aerosol, SPRAY 2 SPRAYS INTO EACH NOSTRIL TWICE A DAY AS DIRECTED, Disp: 30 mL, Rfl: 3    buPROPion  (WELLBUTRIN  XL) 150 mg extended release 24 hr tablet, Take 1 Tablet (150 mg total) by mouth Daily, Disp: 90 Tablet, Rfl: 11    citalopram  (CELEXA ) 20 mg Oral Tablet, Take 1 Tablet (20 mg total) by mouth Daily, Disp: 90 Tablet, Rfl: 11    fluticasone  propionate (FLONASE ) 50 mcg/actuation Nasal Spray, Suspension, SPRAY 1 SPRAY INTO EACH NOSTRIL EVERY DAY, Disp: 48 g, Rfl: 1    gabapentin  (NEURONTIN ) 300 mg Oral Capsule, Take 1 Capsule (300  mg total) by mouth Three times a day for 30 days, Disp: 90 Capsule, Rfl: 0    hydroCHLOROthiazide  (MICROZIDE ) 12.5 mg Oral Capsule, Take 1 Capsule (12.5 mg total) by mouth Daily, Disp: 90 Capsule, Rfl: 11    minocycline (MINOCIN) 100 mg Oral Capsule, Take 1 Capsule (100 mg total) by mouth Twice daily, Disp: , Rfl:     Niacinamide (NICOMIDE-T) 500 mg Oral Tablet, Take 1 Tablet (500 mg total) by mouth Daily, Disp: , Rfl:     pantoprazole  (PROTONIX ) 40 mg Oral Tablet, Delayed Release (E.C.), Take 1 Tablet (40 mg total) by mouth Daily, Disp: 90 Tablet, Rfl: 3    silver sulfADIAZINE (SILVADENE) 1 % Cream, Apply topically Twice daily, Disp: , Rfl:     SYMBICORT  160-4.5 mcg/actuation Inhalation oral inhaler, INHALE 2 PUFFS BY MOUTH TWICE A DAY, Disp: 30.6 Each, Rfl: 6    Review of Systems:  Denies constipation or sedation  All other systems negative except for any noted in the HPI    Physical Exam:  There were no vitals filed for this visit.        There is no height or weight on file to calculate BMI.  Appears stated age  Speech clear  Affect appropriate  Alert  No scleral icterus  Mmm  Adequate peripheral perfusion  Unlabored respirations  No abdominal distention  No edema  No rash    MSK:  Lumbar   ROM:  Limited flexion, Limited extension, and Limited rotation   Palpation: Tender lower lumbar   Motor: 5/5 in LEs b/l   Sensory: Intact in LEs b/l   Facet loading: Positive bilaterally    Facet TTP: Positive bilaterally   Trigger points: Lumbar paraspinal bilateral   Straight leg raise: Positive LLE      SI Joint   Palpation: Positive bilaterally   Provocation  Test :      [place] (-)        Imaging:  Reviewed.  11/2016 L/S MRI  T12-L1: Disc desiccation is identified with mild disc bulging. Mild reactive changes are seen within the adjacent endplates. No central stenosis or exiting nerve root impingement is seen.     L1-L2: Negative.     L2-L3: Disc desiccation is identified, and there may be a tiny right paracentral disc  extrusion which minimally extends up the posterior margin of L2. This minimally indents the ventral thecal sac without traversing nerve root impingement or pathologic   narrowing of the thecal sac. No exiting nerve root impingement is seen.     L3-L4: Negative.     L4-L5: Disc desiccation is combined with moderate bilateral facet and ligamentum flavum hypertrophy. No central stenosis or exiting nerve root impingement is seen.     L5-S1: Disc desiccation is identified with disc bulging and reactive changes in the adjacent endplates. There is a superimposed broad-based right paracentral disc protrusion which produces mild asymmetric mass effect upon the traversing right S1 nerve   root. Mild right and moderate left facet and ligamentum flavum hypertrophy is present. The thecal sac is not pathologically narrowed. There is contacting of both exiting L5 nerve roots laterally by disc and osteophyte.     Outside Records:  Reviewed.    Assessment:    ICD-10-CM    1. Chronic lumbar radiculopathy  M54.16 MRI Lumbosacral without contrast      2. Morbid obesity with BMI of 50.0-59.9, adult (CMS HCC)  E66.01 MRI Lumbosacral without contrast    Z68.43       3. Acute right-sided low back pain without sciatica  M54.50 MRI Lumbosacral without contrast      4. Chronic left-sided lumbar radiculopathy  M54.16 MRI Lumbosacral without contrast             Plan:  53 yo F with chronic low back and LLE pain. No red flags. Reports 20-30 year history of LBP with LLE pain with forward flexion.  Lumbar Ap-Lat today with flex/ex- lumbar spondylosis.  Has been to PT for 5 weeks with only worsening pain now she has bilateral LE numbness and tingling. Will order new lumbar MRI with plan for LESI. Will increase gabapentin  to 300 mg TID. Follow up after MRI. May consider L4-S1 LMBB with plan for RFA if low back pain refractory to LESI.    First 3 days take one tablet at night, the next 3 days take 1 tablet in the morning and at night, the next 3 days  take 1 tablet in the morning, lunchtime or afternoon, and at night      Gabapentin  increase dose below is the am/morning dose, afternoon dose, and then evening dose    Day 1 300, none, 300;   Day 3 300, 300, 300;     Continue

## 2023-10-15 ENCOUNTER — Encounter (INDEPENDENT_AMBULATORY_CARE_PROVIDER_SITE_OTHER): Admitting: Registered"

## 2023-10-16 ENCOUNTER — Encounter (HOSPITAL_BASED_OUTPATIENT_CLINIC_OR_DEPARTMENT_OTHER): Payer: Self-pay

## 2023-10-16 ENCOUNTER — Ambulatory Visit
Admission: RE | Admit: 2023-10-16 | Discharge: 2023-10-16 | Disposition: A | Discharge status: Home / Self Care | Payer: Self-pay | Source: Ambulatory Visit | Attending: Registered Nurse | Admitting: Registered Nurse

## 2023-10-16 ENCOUNTER — Other Ambulatory Visit: Payer: Self-pay

## 2023-10-16 DIAGNOSIS — Z1231 Encounter for screening mammogram for malignant neoplasm of breast: Secondary | ICD-10-CM | POA: Insufficient documentation

## 2023-10-24 DIAGNOSIS — Z1231 Encounter for screening mammogram for malignant neoplasm of breast: Secondary | ICD-10-CM

## 2023-11-05 ENCOUNTER — Ambulatory Visit: Payer: Self-pay | Admitting: Nurse Practitioner

## 2023-11-05 ENCOUNTER — Other Ambulatory Visit: Payer: Self-pay

## 2023-11-05 VITALS — BP 121/65 | HR 88 | Temp 97.5°F | Resp 18 | Ht 62.0 in | Wt 295.9 lb

## 2023-11-05 DIAGNOSIS — G4733 Obstructive sleep apnea (adult) (pediatric): Secondary | ICD-10-CM | POA: Insufficient documentation

## 2023-11-05 NOTE — Progress Notes (Signed)
 [facility]  [facility] [place] [place]    Patient Name: Sherry Matthews  MRN#: [id number]  DOB: [birth year]  Date of Service: 11/05/2023    CC: Sleep Apnea        Last Clinic Visit: 09/26/2023      Sleep Disorders Current Therapy  Positive Pressure: APAP 10-13     Mask: N20 air touch- medium      HPI: The patient 53 y.o. with obstructive sleep apnea presenting for routine follow up. She states she is doing much better with the air touch cushion on her mask. She is no longer getting skin irritation or blistering. She is also feeling better with the pressure change that was made. She reports decreased awakenings during the night. She is without complaints today.        Apnea Hypopnea index (diagnostic): HST 05/08/2023- AHI 67.4 events/hour; central events recorded on this study           Problems with Therapy:    Mask: N/A    Machine: Denies    Nasal: Denies    Epworth Sleepiness Scale: 15        Sleep structure:   - bedtime: 2100 laying down  - sleep latency: puts mask on 2230-2300; estimates latency to be 30-60 minutes  - Frequent night time awakening: Yes she does wakes during the night at least once- some of which is due to pain and other times it just feels spontaneous to her. She thinks this is better because it used to be 2-3x.   -Up: 0630  -Nap: on weekends sometimes  - excessive caffeine, soda: some days none  -Alcohol: Denies        Associated diseases: Obesity, back pain, asthma, allergies, HTN         Review of Systems:   Constitutional: (-) fever (-) chills (-) fatigue (-) unintentional weight loss  Eyes: (-) vision changes (-) itchy or burning eyes  Ears/Nose/Thoat/Mouth: (-) snoring  (-) oral lesions  (-) sore throat (-) nasal congestion  CV: (-) chest pain (-) palpitations (-) edema   Pulm: (-) shortness of breath (-) cough (-) nocturnal gasping  (-) dyspnea on exertion  GI: (-) abl pain (-) nausea (-) vomiting (-) diarrhea (-) constipation  Genitourinary: (-) dysuria (-) nocturia  (-) incontinence (-) urgency  Musculoskeletal: (-) joint pain (-) joint swelling (-) muscle pain  Neurologic: (-) memory loss (-) paresthesias (-) dizziness (-) headaches  Skin: (-) rash (-) itching (-) jaundice  Psych: (-) anxiety (-) depression (-) insomnia   Endocrine: (-) heat intolerance (-) cold intolerance  Heme/Lymph: (-) lymphadenopathy (-) easy bruising  Allergy/Immune: (-) hives (-) seasonal allergies       Past Medical History:   Past Medical History:   Diagnosis Date    Allergic rhinitis     Asthma     uses inhalers when allergies are bad    BMI 45.0-49.9, adult (CMS HCC)     Bullous pemphigoid (CMS HCC)     Chronic left-sided lumbar radiculopathy 11/30/2020    CPAP (continuous positive airway pressure) dependence     AutoCPAP 10-13 cm H2O    Esophageal reflux     Hypertension     due to Contrave     Mood disorder (CMS HCC)     Neck problem     harder turning head to the left    OSA on CPAP 11/30/2020    Sleep apnea  Allergies: Allergies[1]      Current Medications:   Prior to Admission medications    Medication Sig Start Date End Date Taking? Authorizing Provider   azelastine  (ASTELIN ) 137 mcg (0.1 %) Nasal Spray, Non-Aerosol SPRAY 2 SPRAYS INTO EACH NOSTRIL TWICE A DAY AS DIRECTED 04/01/23   Chari Como, MD   buPROPion  (WELLBUTRIN  XL) 150 mg extended release 24 hr tablet Take 1 Tablet (150 mg total) by mouth Daily 08/15/23   America Bake, APRN,FNP-BC   citalopram  (CELEXA ) 20 mg Oral Tablet Take 1 Tablet (20 mg total) by mouth Daily 08/15/23   America Bake, APRN,FNP-BC   fluticasone  propionate (FLONASE ) 50 mcg/actuation Nasal Spray, Suspension SPRAY 1 SPRAY INTO EACH NOSTRIL EVERY DAY 10/31/22   Chari Como, MD   gabapentin  (NEURONTIN ) 300 mg Oral Capsule Take 1 Capsule (300 mg total) by mouth Three times a day for 30 days 09/30/23 10/30/23  Lenoard Rad, MD   hydroCHLOROthiazide  (MICROZIDE ) 12.5 mg Oral Capsule Take 1 Capsule (12.5 mg total) by mouth Daily 08/15/23   America Bake,  APRN,FNP-BC   minocycline (MINOCIN) 100 mg Oral Capsule Take 1 Capsule (100 mg total) by mouth Twice daily 08/11/21   Provider, Historical   Niacinamide (NICOMIDE-T) 500 mg Oral Tablet Take 1 Tablet (500 mg total) by mouth Daily 02/05/21   Provider, Historical   pantoprazole  (PROTONIX ) 40 mg Oral Tablet, Delayed Release (E.C.) Take 1 Tablet (40 mg total) by mouth Daily 08/15/23   America Bake, APRN,FNP-BC   silver sulfADIAZINE (SILVADENE) 1 % Cream Apply topically Twice daily    Provider, Historical   SYMBICORT  160-4.5 mcg/actuation Inhalation oral inhaler INHALE 2 PUFFS BY MOUTH TWICE A DAY 03/22/22   America Bake, APRN,FNP-BC         Physical Exam: The patient's current vitals are BP 121/65   Pulse 88   Temp 36.4 C (97.5 F)   Resp 18   Ht 1.575 m (5' 2)   Wt 134 kg (295 lb 13.7 oz)   LMP 08/27/2021   SpO2 99%   BMI 54.11 kg/m         Constitutional: NAD, pleasant, appears stated age  Eyes: Conjunctiva clear, no drainage noted.   Oropharynx: Dentition good   Musculoskeletal: Moving all extremities, No observed joint swelling or erythema  Skin: Skin warm and dry without discoloration  Psychiatric: Patient is alert, displaying appropriate behavior and speech       PAP Compliance Report Data Review: 10/04/2023 - 11/02/2023  DME: [facility]  Machine Model: Resmed Airsense 11; set up 07/10/2023   Setting: APAP 10-13 cm of H2O  Average usage (days): 7 hours 46 minutes  % of sleep >=4 hours use: 100%  Avg air leak time: 9.1  AHI: 2.3      ASSESSMENT/ PLAN:      1. Obstructive Sleep Apnea  *Good response and benefit of therapy  *Apnea well controlled.   *Continue to do well on positive airway pressure therapy with great subjective and objective compliance.  *Advise obtain replacement equipment every three to six months.   *Advised patient do not drive or operate heavy equipment or machinery if fatigued, drowsy or sleepy due to risk of injury.  *Pt informed comorbid conditions may worsen if sleep apnea is not  treated adequately.    *Reviewed manufacturer recommended methods for machine and supply maintenance         RTC: 8 month follow up or prior if patient has issues or problems.  Townsend Cudworth D Sayan Aldava, APRN,NP-C 11/05/23 15:54    Dawn Kiper D. Rockne Chyle APRN, FNP-C  [facility]  Department of Medicine  Section of Pulmonary and Sleep Medicine              [1]   Allergies  Allergen Reactions    Latex Rash    Carrot Rash    Eggs     Peanut     Shellfish Derived     Whey     Lima Bean Hives/ Urticaria

## 2023-11-26 ENCOUNTER — Encounter (HOSPITAL_BASED_OUTPATIENT_CLINIC_OR_DEPARTMENT_OTHER): Payer: Self-pay | Admitting: Registered Nurse

## 2023-11-26 ENCOUNTER — Other Ambulatory Visit: Payer: Self-pay

## 2023-11-26 ENCOUNTER — Ambulatory Visit: Attending: Registered Nurse | Admitting: Registered Nurse

## 2023-11-26 VITALS — BP 130/88 | HR 80 | Temp 97.6°F | Ht 61.0 in | Wt 291.9 lb

## 2023-11-26 DIAGNOSIS — Z0000 Encounter for general adult medical examination without abnormal findings: Secondary | ICD-10-CM | POA: Insufficient documentation

## 2023-11-26 MED ORDER — MINOCYCLINE 100 MG CAPSULE: 100.0000 mg | ORAL_CAPSULE | Freq: Two times a day (BID) | ORAL | 4 refills | Status: DC

## 2023-11-26 MED ORDER — FLUTICASONE PROPIONATE 50 MCG/ACTUATION NASAL SPRAY,SUSPENSION
1.0000 | Freq: Every day | NASAL | 11 refills | Status: AC
Start: 2023-11-26 — End: ?

## 2023-11-26 MED ORDER — AZELASTINE 137 MCG (0.1 %) NASAL SPRAY
2.0000 | Freq: Two times a day (BID) | NASAL | 11 refills | Status: AC
Start: 2023-11-26 — End: ?

## 2023-11-26 MED ORDER — NIACINAMIDE 500 MG TABLET: 500.0000 mg | ORAL_TABLET | Freq: Every day | ORAL | 4 refills | Status: DC

## 2023-11-26 NOTE — Progress Notes (Signed)
 [facility]: Family Medicine Office Visit  Annual Well Women Visit      Service date: 11/26/2023:   Birth date: [birth year]    Sherry Matthews is a 53 y.o. female who presents to [facility] today with chief complaint of Annual Wellness Exam   annual well exam.   Please see separate section for any listed chronic or acute medical diagnosis if indicated.   Health Care Maintenance / HPI   Overall she feels well and has no concerns about her body or health today.   Denies changes in medical, surgical or family history.     Establish with sleep Medicine history of OSA currently on CPAP.  Establish with physiatry for chronic lumbar pain, MRI scheduled soon with plans for potential nerve block.  Has been following with Allergy for chronic allergic rhinitis.  Symptoms well managed with Astelin  and Flonase  nasal spray.  Requesting if PCP can continue management of nasal sprays.  Has been seeing Allergy once a year with no changes.  [facility]abl[facility] with history of bullous pemphigoid with flares well-controlled.  Currently on minocycline and niacinamide.  Follows with Dermatology yearly for medication refills.  Requesting if PCP can continue management of these medications to reduce the number of specialist in visits that she is seeing.  Feels that symptoms are well controlled in flares are minimal.  Has no concerns.    - PHQ-2 Depression screening  Over the past 2 weeks have you been bother by:  Little interested or pleasure in doing things? no  Feeling down depressed or hopeless? no   reports that she has never smoked. She has never used smokeless tobacco. She reports that she does not currently use alcohol. She reports that she does not use drugs.  - Occupation: [employer]  -Alcohol Misuse: no  -Diet History: well balanced  -Exercise History:  no routine currently due to back pain   -STI Screening-Any known exposure/risk to STI's:  no  -Routine Eye and Dentist Visits : advised to  schedule    -Cervical cancer (every 3 yrs if negative age 42-73yrs PAP only or every 5 yrs age 41-65 with cotesting, >65 d/c if past 10 years negative): Up to date, not currently due Hx of abnormal pap smears: no.   -Annual / biannual Mammography: Up to date.   -Colorectal cancer: UTD see HM section.    Health Maintenance: Pending and Last Completed         Date Due Completion Date    Shingles Vaccine (1 of 2) 11/25/2024 (Originally 09/04/2018) ---    Pneumococcal Vaccination, Age 24+ (1 of 1 - PCV) 11/25/2024 (Originally 09/04/2018) ---    Influenza Vaccine (1) 01/26/2024 04/08/2013    Depression Screening 08/14/2024 08/15/2023    Breast Cancer Screening 10/15/2024 10/16/2023    NonMedicare Preventative Exam 11/25/2024 11/26/2023    Pap smear/HPV 03/02/2026 03/02/2021    Colonoscopy 01/25/2032 01/24/2022    Adult Tdap-Td (2 - Td or Tdap) 04/15/2032 04/15/2022          MEDICAL HISTORY     Review of Systems:  Constitutional: no fever, chills, or significant weight changes.  Eyes: negative for vision changes.  Ears, nose, mouth, throat, and face: negative for ear pain or sore throat.   Respiratory: negative for dyspnea.  Cardiovascular: negative for chest pain.  Gastrointestinal: negative for nausea, vomiting, bowel changes.  Genitourinary:negative for dysuria, hematuria, menstrual changes.   Integument/breast: negative for rashes.  Musculoskeletal:negative for new myalgias or arthralgias.  Neurological: no muscle weakness.   Behvioral/Psych: negative for depression and anxiety.    Past Medical History:   Diagnosis Date    Allergic rhinitis     Asthma     uses inhalers when allergies are bad    BMI 45.0-49.9, adult (CMS HCC)     Bullous pemphigoid (CMS HCC)     Chronic left-sided lumbar radiculopathy 11/30/2020    CPAP (continuous positive airway pressure) dependence     AutoCPAP 10-13 cm H2O    Esophageal reflux     Hypertension     due to Contrave     Mood disorder (CMS HCC)     Neck problem     harder turning head to the  left    OSA on CPAP 11/30/2020    Sleep apnea          Social History[1]  Allergies[2]    Outpatient Medications Marked as Taking for the 11/26/23 encounter (Office Visit) with Tisa Palma, APRN,FNP-BC   Medication Sig    azelastine  (ASTELIN ) 137 mcg (0.1 %) Nasal Spray, Non-Aerosol Administer 2 Sprays into each nostril Twice daily Use in each nostril as directed    buPROPion  (WELLBUTRIN  XL) 150 mg extended release 24 hr tablet Take 1 Tablet (150 mg total) by mouth Daily    citalopram  (CELEXA ) 20 mg Oral Tablet Take 1 Tablet (20 mg total) by mouth Daily    fluticasone  propionate (FLONASE ) 50 mcg/actuation Nasal Spray, Suspension Administer 1 Spray into each nostril Daily 1 spray each nostril 1 time per day    gabapentin  (NEURONTIN ) 300 mg Oral Capsule Take 1 Capsule (300 mg total) by mouth Three times a day for 30 days    hydroCHLOROthiazide  (MICROZIDE ) 12.5 mg Oral Capsule Take 1 Capsule (12.5 mg total) by mouth Daily    minocycline (MINOCIN) 100 mg Oral Capsule Take 1 Capsule (100 mg total) by mouth Twice daily    Niacinamide (NICOMIDE-T) 500 mg Oral Tablet Take 1 Tablet (500 mg total) by mouth Daily    pantoprazole  (PROTONIX ) 40 mg Oral Tablet, Delayed Release (E.C.) Take 1 Tablet (40 mg total) by mouth Daily    silver sulfADIAZINE (SILVADENE) 1 % Cream Apply topically Twice daily (Patient taking differently: Apply topically Twice daily PRN)    SYMBICORT  160-4.5 mcg/actuation Inhalation oral inhaler INHALE 2 PUFFS BY MOUTH TWICE A DAY (Patient taking differently: Take 2 Puffs by inhalation Twice daily PRN)       The patient's medications were reviewed and updated at the end of the clinic visit today.  OBJECTIVE   BP 130/88   Pulse 80   Temp 36.4 C (97.6 F) (Thermal Scan)   Ht 1.549 m (5' 1)   Wt 132 kg (291 lb 14.2 oz)   LMP 08/27/2021   BMI 55.15 kg/m     Patient's last menstrual period was 08/27/2021.Body mass index is 55.15 kg/m.  Wt Readings from Last 3 Encounters:   11/26/23 132 kg (291 lb 14.2  oz)   11/05/23 134 kg (295 lb 13.7 oz)   09/26/23 132 kg (289 lb 14.5 oz)     BP Readings from Last 3 Encounters:   11/26/23 130/88   11/05/23 121/65   09/26/23 135/87     Physical examination:  General: Well-developed and well-nourished female, well-appearing and in no acute distress.  Eyes: Pupils equal, round, and reactive to light and accommodation. No conjunctival injection or scleral icterus noted.  HENT: TMs appear normal. Mucous membranes moist. Oropharynx without erythema or exudate.  Neck: No lymphadenopathy or thyromegaly.  Lungs: Clear to auscultation bilaterally.    Cardiovascular: Regular rate and rhythm. No murmurs, rubs, or gallops.  Pulses 2+ and symmetric.   Abdomen: Soft, Bowel sounds present in all 4 quadrants. No organomegaly noted.   Genitourinary: No suprapubic  tenderness.   Musculoskeletal: Well perfused and without deformity or edema. Full ROM. Strength 5/5 throughout.   Skin: Warm and dry. No rashes present.  Neurologic: grossly intact. Sensation intact and symmetric to light touch. Normal tone. Steady gait.   Psychiatric: Alert and oriented to person, place, and time. Pleasant and cooperative with exam. Does not appear anxious or depressed.    The 10-year ASCVD risk score (Arnett DK, et al., 2019) is: 3.6%    Values used to calculate the score:      Age: 75 years      Sex: Female      Is Non-Hispanic African American: No      Diabetic: No      Tobacco smoker: No      Systolic Blood Pressure: 130 mmHg      Is BP treated: Yes      HDL Cholesterol: 41 mg/dL      Total Cholesterol: 196 mg/dL    Laboratory Studies/Data review:    Lab Results   Component Value Date    CHOLESTEROL 196 08/15/2023    HDLCHOL 41 (L) 08/15/2023    LDLCHOL 134 (H) 08/15/2023    TRIG 117 08/15/2023     Lab Results   Component Value Date    GLUCOSEFAST 100 (H) 07/02/2022     Lab Results   Component Value Date    HA1C 5.3 08/15/2023       ASSESSMENT/PLAN     Orders Placed This Encounter    Schedule Follow-Up     azelastine  (ASTELIN ) 137 mcg (0.1 %) Nasal Spray, Non-Aerosol    fluticasone  propionate (FLONASE ) 50 mcg/actuation Nasal Spray, Suspension    minocycline (MINOCIN) 100 mg Oral Capsule    Niacinamide (NICOMIDE-T) 500 mg Oral Tablet       ICD-10-CM    1. Annual physical exam  Z00.00         Recommended screening guidelines discussed with patient and shared decision making completed.   - Age appropriate screening completed/ordered today.   -Patient counseled today about the following preventive/treatment guideline for her age as above: diet,exercise, health body weight, and alcohol use recommendations.   -Recommend a minimum of thirty minutes of moderate exercise (ie walking at a brisk pace, bicycling, etc.) 4-5 days along with 2-3 days of strength training (30 minutes) per week to prevent weight gain from age-related loss of metabolism.  Recommend increasing the intensity (ie running, jogging, and/or resistance training) and/or the duration of time spent exercising to facilitate weight loss and improve cardiac and respiratory fitness.  - reviewed recommended vaccinations, patient deferred at this time.   - agreed to take over management of maintenance medications for allergic rhinitis and her bullous pemphigoid.  - follow-up in 6 months for chronic medication check.                Health Maintenance: Pending and Last Completed         Date Due Completion Date    Shingles Vaccine (1 of 2) 11/25/2024 (Originally 09/04/2018) ---    Pneumococcal Vaccination, Age 37+ (1 of 1 - PCV) 11/25/2024 (Originally 09/04/2018) ---    Influenza Vaccine (1) 01/26/2024 04/08/2013    Depression Screening 08/14/2024 08/15/2023  Breast Cancer Screening 10/15/2024 10/16/2023    NonMedicare Preventative Exam 11/25/2024 11/26/2023    Pap smear/HPV 03/02/2026 03/02/2021    Colonoscopy 01/25/2032 01/24/2022    Adult Tdap-Td (2 - Td or Tdap) 04/15/2032 04/15/2022          Immunization History   Administered Date(s) Administered    Covid-19  Vaccine,Moderna,12 Years+ 10/08/2019, 11/05/2019    Diptheria & Tetanus Toxoid,Adsorbed, 42YRS & OLDER 05/27/1996    Influenza Vaccine, 6 month-adult 02/25/2011, 02/03/2012, 04/08/2013    Tetanus Toxoid/Diphtheria Toxoid/Acellular Pertussis Vaccine, Adsorbed 04/15/2022     Follow up on or around: 05/28/2024. Reason for Follow Up: chronic medication check    No follow-ups on file.    The patient was given ample opportunity to ask questions and those questions were answered to the patient's satisfaction. The patient was encouraged to be involved in their own care, and all diagnoses, medications, and medication side-effects were discussed. The patient was told to contact me with any additional questions or concerns, and to go to the emergency department in an emergency.      The patient has been educated and verbalized understanding regarding the services provided during this visit.    Alan Cooler, APRN,FNP-BC  11/26/2023, 07:44    Alan Cooler, APRN,FNP-BC  FAMILY MEDICINE, [facility]  Operated by [facility]  [address]  [place] [address]  Dept: [phone]  Dept Fax: [phone]    Patient was seen independently.    This document was generated using a voice recognition system and transcription. All documents are proofed as best as possible, but it may have misspelled words, incorrect words, or syntax and grammatical errors because of the imperfect nature of the system.       [1]   Social History  Tobacco Use    Smoking status: Never    Smokeless tobacco: Never   Vaping Use    Vaping status: Never Used   Substance Use Topics    Alcohol use: Not Currently    Drug use: Never   [2]   Allergies  Allergen Reactions    Latex Rash    Carrot Rash    Eggs     Peanut     Shellfish Derived     Whey     Lima Bean Hives/ Urticaria

## 2023-12-11 ENCOUNTER — Ambulatory Visit
Admission: RE | Admit: 2023-12-11 | Discharge: 2023-12-11 | Disposition: A | Discharge status: Home / Self Care | Payer: Self-pay | Source: Ambulatory Visit | Attending: ANESTHESIOLOGY | Admitting: ANESTHESIOLOGY

## 2023-12-11 ENCOUNTER — Other Ambulatory Visit: Payer: Self-pay

## 2023-12-11 DIAGNOSIS — M5416 Radiculopathy, lumbar region: Secondary | ICD-10-CM | POA: Insufficient documentation

## 2023-12-11 DIAGNOSIS — M545 Low back pain, unspecified: Secondary | ICD-10-CM | POA: Insufficient documentation

## 2023-12-11 DIAGNOSIS — Z6841 Body Mass Index (BMI) 40.0 and over, adult: Secondary | ICD-10-CM | POA: Insufficient documentation

## 2023-12-15 ENCOUNTER — Other Ambulatory Visit (INDEPENDENT_AMBULATORY_CARE_PROVIDER_SITE_OTHER): Payer: Self-pay | Admitting: ANESTHESIOLOGY

## 2023-12-15 MED ORDER — GABAPENTIN 300 MG CAPSULE
300.0000 mg | ORAL_CAPSULE | Freq: Three times a day (TID) | ORAL | 0 refills | Status: DC
Start: 2023-12-15 — End: 2024-01-15

## 2023-12-15 NOTE — Nursing Note (Signed)
 Medication request sent to the provider for signature.  Delon Hope, RN

## 2023-12-16 ENCOUNTER — Ambulatory Visit: Payer: Self-pay | Attending: PHYSICIAN ASSISTANT | Admitting: PHYSICIAN ASSISTANT

## 2023-12-16 ENCOUNTER — Other Ambulatory Visit: Payer: Self-pay

## 2023-12-16 DIAGNOSIS — M4726 Other spondylosis with radiculopathy, lumbar region: Secondary | ICD-10-CM

## 2023-12-16 DIAGNOSIS — M47816 Spondylosis without myelopathy or radiculopathy, lumbar region: Secondary | ICD-10-CM | POA: Insufficient documentation

## 2023-12-16 DIAGNOSIS — M5416 Radiculopathy, lumbar region: Secondary | ICD-10-CM | POA: Insufficient documentation

## 2023-12-16 DIAGNOSIS — M545 Low back pain, unspecified: Secondary | ICD-10-CM | POA: Insufficient documentation

## 2023-12-16 NOTE — Progress Notes (Signed)
 PHYSIATRY, SPINE CENTER  [address]  [place] [address]  Operated by [facility]     Name: Sherry Matthews MRN:  [id number]   Date:   12/16/2023 Age:     53 y.o.      Chief Complaint:   Chief Complaint   Patient presents with    MRI Results           HISTORY OF PRESENT ILLNESS:     Sherry Matthews is a 53 y.o. female who presents for lumbar MRI follow up.  He is a 6/10 today in located across the low back.  Radiates into the left posterior thigh, calf, and into the sole of foot affecting all toes.  Describes it as a shooting pain with intermittent numbness and tingling.  When sitting at worse, notes the leg pain is more significant than the back, but with walking, prolonged standing, or any repetitive movements, the back pain is more intense than the leg pain.  With extension of the lumbar spine, she is able to produce the shooting pain into the leg.  No weakness in the legs. She feels she has been altering her gait due to the low back pain has been told by physical therapy that she is favoring the left leg.  She has been taking gabapentin  300 mg b.i.d. consistently she is often forgetting the mid day dose.  She has not noticed any improvement, but denies any dizziness or drowsiness on the medication.  She has having difficulties getting adequate nighttime relief she is often waking due to pain. She recently finished PT and states it worsened the pain, especially following traction.      TREATMENT HISTORY:     Helpful Not Helpful Not Tried  Comments   MBB       RFA/Cryoablation       Epidural       Transforaminal/  Nerve root block        SI injection       Intraarticular Joint        Sympathetic Block       Other        TPI       GTB        SCS       Pain pump         Physical Therapy or HEP   x  Recent PT: Recently finished with transition to HEP. Traction worse.    Massage Therapy       Acupuncture        Chiropractor       Exercise Physiologist        Counseling/CBT           ROS:   Denies changes  in bowel or bladder function  All other systems negative except for any noted in the HPI    IMAGING:  Reviewed.    OUTSIDE RECORDS:  Reviewed.     ASSESSMENT:  Assessment/Plan   1. Chronic lumbar radiculopathy    2. Low back pain    3. Lumbar spondylosis        PLAN:  Ordered L5-S1 LESI with Dr. Woodie at pain clinic. If minimal relief of LBP, likely b/l L4-S1 MBBs x2 to RFA.    She will take 2 doses of gabapentin  at night as she is having difficulty remembering the midday dose. If minimal control, can increase morning dose as well.   Continue home exercises.   F/u 2 weeks after LESI or earlier  if needed.     Risks/benefits of all pharmacologic and interventional treatments discussed and questions answered.    Past Medical History:  Past Medical History:   Diagnosis Date    Allergic rhinitis     Asthma     uses inhalers when allergies are bad    BMI 45.0-49.9, adult (CMS HCC)     Bullous pemphigoid (CMS HCC)     Chronic left-sided lumbar radiculopathy 11/30/2020    CPAP (continuous positive airway pressure) dependence     AutoCPAP 10-13 cm H2O    Esophageal reflux     Hypertension     due to Contrave     Mood disorder (CMS HCC)     Neck problem     harder turning head to the left    OSA on CPAP 11/30/2020    Sleep apnea            Family History:  Family Medical History:       Problem Relation (Age of Onset)    Alcohol abuse Mother, Father    Cancer Mother    Cirrhosis Mother    Lung Cancer Maternal Grandfather    Obesity Mother, Father    Ovarian Cancer Mother              Social History:  Social History     Socioeconomic History    Marital status: Single     Spouse name: Not on file    Number of children: Not on file    Years of education: Not on file    Highest education level: Not on file   Occupational History    Not on file   Tobacco Use    Smoking status: Never    Smokeless tobacco: Never   Vaping Use    Vaping status: Never Used   Substance and Sexual Activity    Alcohol use: Not Currently    Drug use: Never     Sexual activity: Not on file   Other Topics Concern    Ability to Walk 1 Flight of Steps without SOB/CP Yes    Routine Exercise Not Asked    Ability to Walk 2 Flight of Steps without SOB/CP Yes    Unable to Ambulate Not Asked    Total Care Not Asked    Ability To Do Own ADL's Yes    Uses Walker Not Asked    Other Activity Level Not Asked    Uses Cane Not Asked   Social History Narrative    Not on file     Social Determinants of Health     Financial Resource Strain: Not on file   Transportation Needs: Not on file   Social Connections: Not on file   Intimate Partner Violence: Not on file   Housing Stability: Not on file       Medications:  Current Medications[1]    Physical examination:   LMP 08/27/2021          Gen: Alert &amp; Oriented X 3. Affect appropriate  HEENT: EOMI  Neck: Supple, no elevated JVP  Heart: Extremities well perfused  Lungs: non labored breathing  Abdomen: Non-distended  Skin: no gross lesions appreciated  Ext: purposeful movement of extremities   Lower Extremities:  MS:     Root Right Left  Hip Flexion  L2 5 5  Knee Flexion  L5/S1 5 5  Knee Extension L3 5 5  Dorsiflexion  L4 5 5  Plantarflexion  S1 5 5  Gait was smooth and symmetric with equal arm swing.      Positive facet loading b/l.   No tenderness to palpation along the facet joints, right SI joints.    Full ROM bilateral lower extremities.    Negative slump testing.  Negative straight leg testing to 30-75 degrees.      Neuro:  DTR's 2+ right patella, 2+ left patella  2+ right achilles, 2+ left achilles  Lower Extremity Tone Normal  Lower Extremity Sensation Intact to light touch bilaterally        Griffen Frayne, PA-C                           [1]   Current Outpatient Medications:     azelastine  (ASTELIN ) 137 mcg (0.1 %) Nasal Spray, Non-Aerosol, Administer 2 Sprays into each nostril Twice daily Use in each nostril as directed, Disp: 30 mL, Rfl: 11    buPROPion  (WELLBUTRIN  XL) 150 mg extended release 24 hr tablet, Take 1 Tablet (150 mg  total) by mouth Daily, Disp: 90 Tablet, Rfl: 11    citalopram  (CELEXA ) 20 mg Oral Tablet, Take 1 Tablet (20 mg total) by mouth Daily, Disp: 90 Tablet, Rfl: 11    fluticasone  propionate (FLONASE ) 50 mcg/actuation Nasal Spray, Suspension, Administer 1 Spray into each nostril Daily 1 spray each nostril 1 time per day, Disp: 48 g, Rfl: 11    gabapentin  (NEURONTIN ) 300 mg Oral Capsule, Take 1 Capsule (300 mg total) by mouth Three times a day for 30 days, Disp: 90 Capsule, Rfl: 0    hydroCHLOROthiazide  (MICROZIDE ) 12.5 mg Oral Capsule, Take 1 Capsule (12.5 mg total) by mouth Daily, Disp: 90 Capsule, Rfl: 11    minocycline  (MINOCIN ) 100 mg Oral Capsule, Take 1 Capsule (100 mg total) by mouth Twice daily, Disp: 180 Capsule, Rfl: 4    Niacinamide  (NICOMIDE-T) 500 mg Oral Tablet, Take 1 Tablet (500 mg total) by mouth Daily, Disp: 90 Tablet, Rfl: 4    pantoprazole  (PROTONIX ) 40 mg Oral Tablet, Delayed Release (E.C.), Take 1 Tablet (40 mg total) by mouth Daily, Disp: 90 Tablet, Rfl: 3    silver sulfADIAZINE (SILVADENE) 1 % Cream, Apply topically Twice daily (Patient taking differently: Apply topically Twice daily PRN), Disp: , Rfl:     SYMBICORT  160-4.5 mcg/actuation Inhalation oral inhaler, INHALE 2 PUFFS BY MOUTH TWICE A DAY (Patient taking differently: Take 2 Puffs by inhalation Twice daily PRN), Disp: 30.6 Each, Rfl: 6

## 2023-12-18 DIAGNOSIS — M5115 Intervertebral disc disorders with radiculopathy, thoracolumbar region: Secondary | ICD-10-CM

## 2023-12-18 DIAGNOSIS — M4807 Spinal stenosis, lumbosacral region: Secondary | ICD-10-CM

## 2023-12-18 DIAGNOSIS — Z6841 Body Mass Index (BMI) 40.0 and over, adult: Secondary | ICD-10-CM

## 2023-12-18 DIAGNOSIS — M47817 Spondylosis without myelopathy or radiculopathy, lumbosacral region: Secondary | ICD-10-CM

## 2023-12-18 DIAGNOSIS — M5117 Intervertebral disc disorders with radiculopathy, lumbosacral region: Secondary | ICD-10-CM

## 2023-12-27 ENCOUNTER — Encounter (INDEPENDENT_AMBULATORY_CARE_PROVIDER_SITE_OTHER): Payer: Self-pay | Admitting: PHYSICIAN ASSISTANT

## 2023-12-31 ENCOUNTER — Telehealth (INDEPENDENT_AMBULATORY_CARE_PROVIDER_SITE_OTHER): Payer: Self-pay | Admitting: ANESTHESIOLOGY

## 2023-12-31 NOTE — Telephone Encounter (Signed)
 CM contacted PT and went over Non Fasting guidelines for upcoming procedure on 08.07.25 with Dr Woodie. PT states they are not currently taking antibiotics. Pt was instructed to inform the [facility] (Pain Clinic) if they start any antibiotics. Between their guideline review date and  up to and including their appt date to notify staff ASAP. Because their appt may be canceled or rescheduled. PT had no further questions at this time Clinic phone number was provided to PT at this time.    12/31/2023  Sherry Matthews, CASE MANAGER

## 2024-01-01 ENCOUNTER — Other Ambulatory Visit: Payer: Self-pay

## 2024-01-01 ENCOUNTER — Ambulatory Visit: Payer: Self-pay | Attending: PHYSICIAN ASSISTANT | Admitting: ANESTHESIOLOGY

## 2024-01-01 DIAGNOSIS — M5416 Radiculopathy, lumbar region: Secondary | ICD-10-CM | POA: Insufficient documentation

## 2024-01-01 NOTE — Nursing Note (Signed)
 [place] Pain Rating Scale     On a scale of 0-10, during the past 24 hours, pain has interfered with you usual activity: 9     On a scale of 0-10, during the past 24 hours, pain has interfered with your sleep: 8    On a scale of 0-10, during the past 24 hours, pain has affected your mood: 10     On a scale of 0-10, during the past 24 hours, pain has contributed to your stress: 10     On a scale of 0-10, what is your overall pain Rating: 5

## 2024-01-01 NOTE — Patient Instructions (Signed)
 [facility]  [address]  [place] [address]  Dept: [phone]  Dept Fax: [phone]  [phone]                                                 SPECIAL PROCEDURES                                     DISCHARGE FORM                                          [phone]      Please follow the instructions listed below for your procedures.  If you have questions concerning your procedure, you may call and leave a message.  Messages will be returned by the end of the next business day.  If you have an emergency, proceed to your local Emergency Department.      PROCEDURE: ESI    Do not drive a car or operate machinery until tomorrow.  Rest today and return to normal activities tomorrow.  If you are on restricted activities by your physician, please continue to follow these.  If you are not sure, contact your physician.  It is possible to experience mild numbness of the lower back and legs.  This is temporary.  If you have soreness at the injection site, the application of heat or ice may be helpful. Mild analgesics may also be used.  In case of severe headache; lie flat to decrease it.  Increase all fluids especially those with caffeine.  Mild analgesics are also appropriate.  If headache is not relieved by these measures, contact the Pain Clinic.  Steroid injections may cause temporary increase of blood sugar levels.    These instructions have been reviewed with the patient and appropriate questions have answers.  Waddell Kirsch, RN 01/01/2024 07:44

## 2024-01-01 NOTE — Procedures (Signed)
 [facility]  [address]  [facility] [place] [address]  Operated by [facility]  Procedure Note    Name: Sherry Matthews MRN:  [id number]   Date: 01/01/2024 DOB:  Oct 04, 1970 (53 y.o.)         [address] - INJECT DIAGN/THERAP SUBSTANCE W/ IMAGE GUIDANCE, INTERLAMINAR EPIDURAL/SUBARACHNOID, LUMBAR/SACRAL (AMB ONLY)    Performed by: Woodie Katz, MD  Authorized by: Boni Fleeting, PA-C    Time Out:     Immediately before the procedure, a time out was called:  Yes    Patient verified:  Yes    Procedure Verified:  Yes    Site Verified:  Yes    Lumbar Epidural Steroid Injection with Fluoro    Pre Procedure Dx:  Lumbar Radiculitis    Post Procedure Dx:  Lumbar Radiculitis     Guidance:  Fluoroscopy    Level:  L5/S1    Skin anesthetic:  Lidocaine  1% 3 mL    Injectate:  Dexamethasone  10 mg,  Bupivacaine 0.25% 3.5 mL    Contrast:  IsoVue M200 3 mL    Consent and timeout were done.  Patient was placed in the prone position.  Skin was prepped and draped in the routine fashion and sterile technique was maintained throughout.    In an AP view the level listed above was identified.  Skin was anesthetized.  Next, a 6 inch 18G gauge Touhey needle was advanced in the midline until trajectory was established.  Next, a lateral view was obtained.  Once the needle reached the posterior aspect of the spinous processes, loss of resistance technique was utilized.  Once the loss of resistance occurred, contrast was injected under live fluoroscopy. AP and lateral views were obtained  Characteristic epidural spread was noted.  No vascular spread was noted.  After repeat negative aspiration the above solution was injected.  Patient tolerated this well.  Patient returned to the recovery area with no apparent complications.     Katz Woodie, MD

## 2024-01-15 ENCOUNTER — Other Ambulatory Visit: Payer: Self-pay

## 2024-01-15 ENCOUNTER — Ambulatory Visit: Payer: Self-pay | Attending: PHYSICIAN ASSISTANT | Admitting: PHYSICIAN ASSISTANT

## 2024-01-15 DIAGNOSIS — M47816 Spondylosis without myelopathy or radiculopathy, lumbar region: Secondary | ICD-10-CM | POA: Insufficient documentation

## 2024-01-15 DIAGNOSIS — G8929 Other chronic pain: Secondary | ICD-10-CM

## 2024-01-15 DIAGNOSIS — M5416 Radiculopathy, lumbar region: Secondary | ICD-10-CM | POA: Insufficient documentation

## 2024-01-15 DIAGNOSIS — M545 Low back pain, unspecified: Secondary | ICD-10-CM | POA: Insufficient documentation

## 2024-01-15 MED ORDER — GABAPENTIN 300 MG CAPSULE
600.0000 mg | ORAL_CAPSULE | Freq: Three times a day (TID) | ORAL | 1 refills | Status: DC
Start: 2024-01-15 — End: 2024-02-26

## 2024-01-15 NOTE — Progress Notes (Signed)
 PHYSIATRY, SPINE CENTER  [address]  [place] [address]  Operated by [facility]     Name: Sherry Matthews MRN:  [id number]   Date:   01/15/2024 Age:     53 y.o.      Chief Complaint:   Chief Complaint   Patient presents with    Follow Up After Injection     Low Back Pain           HISTORY OF PRESENT ILLNESS:   History of Present Illness  Sherry Matthews is a 53 year old female who presents for a follow-up after an L5-S1 LESI with Dr. Woodie on 01/01/24. Initially, the injection provided 50% relief for her back pain, but this lasted only a week before symptoms worsened. Approximately 25% relief of leg pain. However, notes improvement of the tingling sensation in the left leg. Numbness extends to her toes, with the outer two toes most affected. Leg pain is more severe than back pain, constant throughout the day, and affects her work, sleep, and daily activities. The pain intensity sometimes causes nausea. Straining during bowel movements exacerbates her pain, but there is no involuntary loss of bowel or bladder function. Her current medication regimen includes gabapentin , increased to 900 mg per day, but it is no longer effective. She takes Tylenol, 2000 mg in the morning, without relief. Physical therapy, particularly breathing exercises, was previously beneficial but now causes pain. 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 She has an adjustable desk for standing, but prolonged standing also causes discomfort. She has a hot tub at home, which she has not been using due to the warm season.      TREATMENT HISTORY:     Helpful Not Helpful Not Tried  Comments   MBB       RFA/Cryoablation       Epidural       Transforaminal/  Nerve root block        SI injection       Intraarticular Joint        Sympathetic Block       Other        TPI       GTB        SCS       Pain pump         Physical Therapy or HEP   x  Current HEP  following transition from formal PT   Massage Therapy       Acupuncture        Chiropractor       Exercise Physiologist        Counseling/CBT           ROS:   Denies changes in bowel or bladder function  All other systems negative except for any noted in the HPI    IMAGING:  Reviewed.    OUTSIDE RECORDS:  Reviewed.     ASSESSMENT:  Assessment/Plan   1. Chronic lumbar radiculopathy    2. Lumbar spondylosis    3. Low back pain        Assessment & Plan  Left lumbosacral radiculopathy  Persistent left leg pain with tingling, worsening since last visit. Previous epidural provided temporary relief. Left S1 TFESI for targeted relief and diagnostic clarity. Discussed referral to ortho spine surgery if ineffective.   - Ordered left S1 TFESI injection with Dr. Woodie at the pain clinic.  -  Increase gabapentin  to 300 mg in the morning, 600 mg in the afternoon, and 600 mg at night. If tolerated, increase to 600 mg three times a day.  - Advise alternating acetaminophen and ibuprofen for pain management, with a maximum of 3000 mg of acetaminophen per day.  - F/u 2 weeks after TFESI or earlier if needed.          Risks/benefits of all pharmacologic and interventional treatments discussed and questions answered.    Past Medical History:  Past Medical History:   Diagnosis Date    Allergic rhinitis     Asthma     uses inhalers when allergies are bad    BMI 45.0-49.9, adult (CMS HCC)     Bullous pemphigoid (CMS HCC)     Chronic left-sided lumbar radiculopathy 11/30/2020    CPAP (continuous positive airway pressure) dependence     AutoCPAP 10-13 cm H2O    Esophageal reflux     Hypertension     due to Contrave     Mood disorder (CMS HCC)     Neck problem     harder turning head to the left    OSA on CPAP 11/30/2020    Sleep apnea            Family History:  Family Medical History:       Problem Relation (Age of Onset)    Alcohol abuse Mother, Father    Cancer Mother    Cirrhosis Mother    Lung Cancer Maternal Grandfather    Obesity  Mother, Father    Ovarian Cancer Mother              Social History:  Social History     Socioeconomic History    Marital status: Single     Spouse name: Not on file    Number of children: Not on file    Years of education: Not on file    Highest education level: Not on file   Occupational History    Not on file   Tobacco Use    Smoking status: Never    Smokeless tobacco: Never   Vaping Use    Vaping status: Never Used   Substance and Sexual Activity    Alcohol use: Not Currently    Drug use: Never    Sexual activity: Not on file   Other Topics Concern    Ability to Walk 1 Flight of Steps without SOB/CP Yes    Routine Exercise Not Asked    Ability to Walk 2 Flight of Steps without SOB/CP Yes    Unable to Ambulate Not Asked    Total Care Not Asked    Ability To Do Own ADL's Yes    Uses Walker Not Asked    Other Activity Level Not Asked    Uses Cane Not Asked   Social History Narrative    Not on file     Social Determinants of Health     Financial Resource Strain: Not on file   Transportation Needs: Not on file   Social Connections: Not on file   Intimate Partner Violence: Not on file   Housing Stability: Not on file       Medications:  Current Medications[1]    Physical examination:   LMP 08/27/2021       Pain Score:   7  Gen: Alert &amp; Oriented X 3. Affect appropriate  HEENT: EOMI  Neck: Supple, no elevated JVP  Heart: Extremities well perfused  Lungs: non  labored breathing  Abdomen: Non-distended  Skin: no gross lesions appreciated  Ext: purposeful movement of extremities   MS:     Root Right Left  Hip Flexion  L2 5 5  Knee Flexion  L5/S1 5 5  Knee Extension L3 5 5  Dorsiflexion  L4 5 5  Plantarflexion  S1 5 5  EHL Extension L5 5 5    Patient is sitting uncomfortably in chair.  When on table, sits with left leg extended laterally.  Positive facet loading bilaterally.    Tenderness to palpation through bilateral PSIS, lumbar facets, left gluteal musculature.  No tenderness to palpation along the spinous process,  paraspinal musculature, greater trochanters.     Full ROM bilateral lower extremities.    Negative slump testing.  Negative straight leg testing to 30-75 degrees.        Neuro:  DTR's   2+ right patella, 2+ left patella  2+ right achilles, 2+ left achilles    Lower Extremity Tone   Normal on the RIGHT  Normal on the LEFT    Lower Extremity Sensation   Diminished sensation to light touch approximating left S1 distribution. Mild through L5.     This note was created with assistance from [employer] via capture of conversational audio.      Maeleigh Buschman, PA-C                             [1]   Current Outpatient Medications:     azelastine  (ASTELIN ) 137 mcg (0.1 %) Nasal Spray, Non-Aerosol, Administer 2 Sprays into each nostril Twice daily Use in each nostril as directed, Disp: 30 mL, Rfl: 11    buPROPion  (WELLBUTRIN  XL) 150 mg extended release 24 hr tablet, Take 1 Tablet (150 mg total) by mouth Daily, Disp: 90 Tablet, Rfl: 11    citalopram  (CELEXA ) 20 mg Oral Tablet, Take 1 Tablet (20 mg total) by mouth Daily, Disp: 90 Tablet, Rfl: 11    fluticasone  propionate (FLONASE ) 50 mcg/actuation Nasal Spray, Suspension, Administer 1 Spray into each nostril Daily 1 spray each nostril 1 time per day, Disp: 48 g, Rfl: 11    gabapentin  (NEURONTIN ) 300 mg Oral Capsule, Take 2 Capsules (600 mg total) by mouth Three times a day for 60 days, Disp: 180 Capsule, Rfl: 1    hydroCHLOROthiazide  (MICROZIDE ) 12.5 mg Oral Capsule, Take 1 Capsule (12.5 mg total) by mouth Daily, Disp: 90 Capsule, Rfl: 11    minocycline  (MINOCIN ) 100 mg Oral Capsule, Take 1 Capsule (100 mg total) by mouth Twice daily, Disp: 180 Capsule, Rfl: 4    Niacinamide  (NICOMIDE-T) 500 mg Oral Tablet, Take 1 Tablet (500 mg total) by mouth Daily, Disp: 90 Tablet, Rfl: 4    pantoprazole  (PROTONIX ) 40 mg Oral Tablet, Delayed Release (E.C.), Take 1 Tablet (40 mg total) by mouth Daily, Disp: 90 Tablet, Rfl: 3    silver sulfADIAZINE (SILVADENE) 1 % Cream, Apply topically Twice daily  (Patient taking differently: Apply topically Twice daily PRN), Disp: , Rfl:     SYMBICORT  160-4.5 mcg/actuation Inhalation oral inhaler, INHALE 2 PUFFS BY MOUTH TWICE A DAY (Patient taking differently: Take 2 Puffs by inhalation Twice daily PRN), Disp: 30.6 Each, Rfl: 6

## 2024-02-02 ENCOUNTER — Encounter (INDEPENDENT_AMBULATORY_CARE_PROVIDER_SITE_OTHER): Payer: Self-pay | Admitting: PHYSICIAN ASSISTANT

## 2024-02-05 ENCOUNTER — Telehealth (INDEPENDENT_AMBULATORY_CARE_PROVIDER_SITE_OTHER): Payer: Self-pay | Admitting: PHYSICIAN ASSISTANT

## 2024-02-05 NOTE — Nursing Note (Signed)
 Message from Macario HERO sent at 02/04/2024  8:25 AM EDT    Summary: RE: Denial    Hello,    The insurance is denying the order for PAIN CL FL TRANSFORAMINAL EPIDURAL LUMBAR/SACRAL 1 LEVEL      has been denied for the following reason: Your doctor told us  that you have nerve pain coming from your low back. A shot to treat this was requested. This shot is used to find where your pain is coming from. You must first have testing by your doctor that did not confirm a source of pain. Testing could include:  A CT or MRI of the bones and nerves in your spine.  A test that uses electrical impulses to show changes in the way your muscles and/or nerves respond (EMG).  A detailed nerve, muscle, and reflex exam by your doctor.    Case Number: [id number]    Clinical Peer Reviewer - [phone] Medical Director [facility] The physician involved in making this decision may also be reached at [phone].                Call History    Contact Date/Time Type Contact Phone/Fax By   02/04/2024 08:15 AM EDT Phone (Outgoing)   Rusty Macario     Created by    Encounter creation information not available   RE: Denial  Received: Today  Maga, Kayla, PA-C  Meaghan Whistler, RN  Authorized 02/05/24-08/03/24. [id number].          Previous Messages       ----- Message -----  From: Montgomery Nestle, RN  Sent: 02/05/2024  10:54 AM EDT  To: Ileana Mor, PA-C  Subject: FW: Denial                                      Peer to peer today 9/11 @ 2 pm  ----- Message -----  From: Rusty Macario  Sent: 02/04/2024   8:25 AM EDT  To: Physiatry Mag      -------    Nestle Montgomery, RN

## 2024-02-10 ENCOUNTER — Encounter (INDEPENDENT_AMBULATORY_CARE_PROVIDER_SITE_OTHER): Payer: Self-pay

## 2024-02-10 ENCOUNTER — Telehealth (INDEPENDENT_AMBULATORY_CARE_PROVIDER_SITE_OTHER): Payer: Self-pay | Admitting: ANESTHESIOLOGY

## 2024-02-10 NOTE — Telephone Encounter (Signed)
 CM left voice message requesting a return phone call to review guidelines. A MyChart message was sent to PT about Guidelines.    02/10/2024  Toribio Molt, CASE MANAGER

## 2024-02-11 ENCOUNTER — Other Ambulatory Visit: Payer: Self-pay

## 2024-02-11 ENCOUNTER — Encounter (INDEPENDENT_AMBULATORY_CARE_PROVIDER_SITE_OTHER): Payer: Self-pay | Admitting: ANESTHESIOLOGY

## 2024-02-11 ENCOUNTER — Ambulatory Visit: Payer: Self-pay | Attending: ANESTHESIOLOGY | Admitting: ANESTHESIOLOGY

## 2024-02-11 DIAGNOSIS — M5416 Radiculopathy, lumbar region: Secondary | ICD-10-CM | POA: Insufficient documentation

## 2024-02-11 DIAGNOSIS — M5418 Radiculopathy, sacral and sacrococcygeal region: Secondary | ICD-10-CM

## 2024-02-11 NOTE — Patient Instructions (Signed)
 [facility]  [address]  [place] [address]  Dept: [phone]  Dept Fax: [phone]  [phone]                                                 SPECIAL PROCEDURES                                     DISCHARGE FORM                                          [phone]      Please follow the instructions listed below for your procedures.  If you have questions concerning your procedure, you may call and leave a message.  Messages will be returned by the end of the next business day.  If you have an emergency, proceed to your local Emergency Department.      PROCEDURE: LT TFESI    Do not drive a car or operate machinery until tomorrow.  Rest today and return to normal activities tomorrow.  If you are on restricted activities by your physician, please continue to follow these.  If you are not sure, contact your physician.  It is possible to experience mild numbness of the lower back and legs.  This is temporary.  If you have soreness at the injection site, the application of heat or ice may be helpful. Mild analgesics may also be used.  In case of severe headache; lie flat to decrease it.  Increase all fluids especially those with caffeine.  Mild analgesics are also appropriate.  If headache is not relieved by these measures, contact the Pain Clinic.  Steroid injections may cause temporary increase of blood sugar levels.    These instructions have been reviewed with the patient and appropriate questions have answers.  [place], [place] 02/11/2024 08:35

## 2024-02-11 NOTE — Nursing Note (Signed)
 [place] Pain Rating Scale     On a scale of 0-10, during the past 24 hours, pain has interfered with you usual activity: 6     On a scale of 0-10, during the past 24 hours, pain has interfered with your sleep: 7    On a scale of 0-10, during the past 24 hours, pain has affected your mood: 7     On a scale of 0-10, during the past 24 hours, pain has contributed to your stress: 9     On a scale of 0-10, what is your overall pain Rating: 4

## 2024-02-11 NOTE — Procedures (Signed)
 [facility]  [address]  [facility] [place] [address]  Operated by [facility]  Procedure Note    Name: Sherry Matthews MRN:  [id number]   Date: 02/11/2024 DOB:  [birth year] (53 y.o.)         INJ ANESTHETIC/STEROID-TRANSFORAMINAL EPIDURAL,LUMBAR/SACRAL,W FLUORO,UP TO 3 LEVELS    Performed by: Woodie Katz, MD  Authorized by: Boni Fleeting, PA-C    Immediately before the procedure, a time out was called:  Yes  Patient verified:  Yes  Procedure verified:  Yes  Site verified:  Yes  Number of levels::  1    Sacral Transforaminal Epidural Steroid Injection with Fluoro    Pre Procedure Dx:  sacral radiculopathy    Post Procedure Dx:  sacral radiculopathy    Guidance:  Fluoroscopy    Fellow/ Resident: Synthia Cope MD     Level: S1    Side: Left    Skin: 3cc 1% lidocaine     Contrast: 3cc M200 Isovue    Injectate: 2cc 0.25% bupivacaine, 10 mg dexamethasone     Consent and timeout were done.  Patient was placed in the prone position.  Skin was prepped and draped in the routine fashion and sterile technique was maintained throughout.    In 30 degrees of ipsilateral oblique and cranial tile 20* the fluoroscope was used to identify the neuroforamen  at: S1                   Overlying skin was anesthetized.  Then, a 22 gauge spinal needle was advanced until trajectory was established toward the superior foramen.  Next, a lateral view was obtained and the needle was advanced until it reached the superior/anterior margin of the foramen.  After negative aspiration, contrast was injected under live fluoroscopy in both AP and lateral views.  Characteristic epidural spread was noted.  No vascular spread was noted.  After repeat negative aspiration the above solution was injected.  Patient tolerated this very well.  Patient returned to the recovery area with no complaints or apparent complications.    Katz Woodie, MD

## 2024-02-17 ENCOUNTER — Ambulatory Visit (HOSPITAL_BASED_OUTPATIENT_CLINIC_OR_DEPARTMENT_OTHER): Payer: Self-pay | Admitting: Registered Nurse

## 2024-02-26 ENCOUNTER — Encounter (INDEPENDENT_AMBULATORY_CARE_PROVIDER_SITE_OTHER): Payer: Self-pay | Admitting: PHYSICIAN ASSISTANT

## 2024-02-26 ENCOUNTER — Ambulatory Visit: Payer: Self-pay | Attending: PHYSICIAN ASSISTANT | Admitting: PHYSICIAN ASSISTANT

## 2024-02-26 ENCOUNTER — Other Ambulatory Visit: Payer: Self-pay

## 2024-02-26 DIAGNOSIS — M545 Low back pain, unspecified: Secondary | ICD-10-CM | POA: Insufficient documentation

## 2024-02-26 DIAGNOSIS — M47816 Spondylosis without myelopathy or radiculopathy, lumbar region: Secondary | ICD-10-CM | POA: Insufficient documentation

## 2024-02-26 DIAGNOSIS — M5416 Radiculopathy, lumbar region: Secondary | ICD-10-CM | POA: Insufficient documentation

## 2024-02-26 DIAGNOSIS — M7918 Myalgia, other site: Secondary | ICD-10-CM | POA: Insufficient documentation

## 2024-02-26 DIAGNOSIS — M4726 Other spondylosis with radiculopathy, lumbar region: Secondary | ICD-10-CM

## 2024-02-26 MED ORDER — TRIAMCINOLONE ACETONIDE 40 MG/ML SUSPENSION FOR INJECTION
40.0000 mg | INTRAMUSCULAR | Status: AC
Start: 2024-02-26 — End: 2024-02-26
  Administered 2024-02-26: 40 mg via INTRAMUSCULAR

## 2024-02-26 MED ORDER — LIDOCAINE HCL 10 MG/ML (1 %) INJECTION SOLUTION
2.0000 mL | Freq: Once | INTRAMUSCULAR | Status: AC
Start: 2024-02-26 — End: 2024-02-26
  Administered 2024-02-26: 20 mg via INTRADERMAL

## 2024-02-26 MED ORDER — PREGABALIN 100 MG CAPSULE
100.0000 mg | ORAL_CAPSULE | Freq: Two times a day (BID) | ORAL | 0 refills | Status: AC
Start: 2024-02-26 — End: ?

## 2024-02-26 NOTE — Procedures (Signed)
 PHYSIATRY, SPINE CENTER  [address]  [place] [address]  Operated by [facility]  Procedure Note    Name: Sherry Matthews MRN:  [id number]   Date: 02/26/2024 DOB:  1970/11/05 (53 y.o.)         [address] - INJ ANESTHETIC/STEROID; SCIATIC NERVE (AMB ONLY-PD)    Performed by: Boni Fleeting, PA-C  Authorized by: Boni Fleeting, PA-C    Time Out:     Immediately before the procedure, a time out was called:  Yes    Patient verified:  Yes    Procedure Verified:  Yes    Site Verified:  Yes  Documentation:      Piriformis injection    Patient was positioned in a prone position.  The area over the piriformis was palpated and identified.  The area was prepped in the usual sterile fashion.  Ethyl chloride spray was utilized for topical anesthesia.  A solution of 2 cc of 1% lidocaine  and 1 cc of 40 milligram/cc of Kenalog  was mixed and injected along the piriformis muscle.  This was performed on the left side only.  Patient tolerated the procedure well had no complications.  They left the clinic in satisfactory condition.        Honestie Kulik, PA-C

## 2024-02-26 NOTE — Progress Notes (Signed)
 PHYSIATRY, SPINE CENTER  [address]  [place] [address]  Operated by [facility]     Name: Sherry Matthews MRN:  [id number]   Date:   02/26/2024 Age:     53 y.o.      Chief Complaint:   Chief Complaint   Patient presents with    Follow Up           HISTORY OF PRESENT ILLNESS:   History of Present Illness  Sherry Matthews is a 53 year old female who presents for an injection follow up.  She went for a left S1 TFESI with Dr. Woodie on 02/11/2024 she notes nearly 100% relief of the S1-related symptoms, but is now describing pain in the left L5 distribution.  Pain starts in the low back and radiates to the left buttock, lateral thigh, lateral shin and goes into the top of the foot affecting the big toe.  Pain worsened with prolonged sitting.  Notes numbness and tingling sensation throughout distribution.  She is currently taking gabapentin  300 mg t.i.d., which is beneficial for the leg symptoms but notes it causes drowsiness. She sometimes uses Tylenol for nocturnal pain.    Previous physical therapy offered temporary relief, particularly with neck exercises, but back exercises did not provide lasting relief. She continues home exercises, including stretches and breathing exercises, twice a week. 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      TREATMENT HISTORY:     Helpful Not Helpful Not Tried  Comments   MBB       RFA/Cryoablation       Epidural x   L5-S1 on 01/01/24 - 50% relief x1 week of leg pain.  >80% relief of the low back pain   Transforaminal/  Nerve root block  x   Left S1 on 02/11/2024 - nearly 100% relief of the S1 radicular symptoms   SI injection       Intraarticular Joint        Sympathetic Block       Other        TPI       GTB        SCS       Pain pump         Physical Therapy or HEP   x  Current HEP following transition from formal PT    Massage Therapy       Acupuncture        Chiropractor       Exercise Physiologist         Counseling/CBT           ROS:   Denies changes in bowel or bladder function  All other systems negative except for any noted in the HPI    IMAGING:  Reviewed.    OUTSIDE RECORDS:  Reviewed.     ASSESSMENT:  Assessment/Plan   1. Left buttock pain    2. Chronic lumbar radiculopathy    3. Low back pain    4. Lumbar spondylosis        Assessment & Plan  Lumbar radiculopathy  - Discontinue gabapentin  and initiate pregabalin  100 mg orally twice daily due to sleepiness with the gabapentin .  Discussed that we ideally do not want her on this medication long term, as this was one of her concerns today.  - Repeat left piriformis injection today in office as the previous injection improved buttock pain.  - Encourage daily  physical therapy exercises, increasing frequency as tolerated.  - Plan for L5 targeted injection after two months, pending authorization.  - Consider surgical consultation if symptoms persist despite conservative management.        Risks/benefits of all pharmacologic and interventional treatments discussed and questions answered.    Past Medical History:  Past Medical History:   Diagnosis Date    Allergic rhinitis     Asthma     uses inhalers when allergies are bad    BMI 45.0-49.9, adult (CMS HCC)     Bullous pemphigoid (CMS HCC)     Chronic left-sided lumbar radiculopathy 11/30/2020    CPAP (continuous positive airway pressure) dependence     AutoCPAP 10-13 cm H2O    Esophageal reflux     Hypertension     due to Contrave     Mood disorder (CMS HCC)     Neck problem     harder turning head to the left    OSA on CPAP 11/30/2020    Sleep apnea            Family History:  Family Medical History:       Problem Relation (Age of Onset)    Alcohol abuse Mother, Father    Cancer Mother    Cirrhosis Mother    Lung Cancer Maternal Grandfather    Obesity Mother, Father    Ovarian Cancer Mother              Social History:  Social History     Socioeconomic History    Marital status: Single     Spouse name: Not on file     Number of children: Not on file    Years of education: Not on file    Highest education level: Not on file   Occupational History    Not on file   Tobacco Use    Smoking status: Never    Smokeless tobacco: Never   Vaping Use    Vaping status: Never Used   Substance and Sexual Activity    Alcohol use: Not Currently    Drug use: Never    Sexual activity: Not on file   Other Topics Concern    Ability to Walk 1 Flight of Steps without SOB/CP Yes    Routine Exercise Not Asked    Ability to Walk 2 Flight of Steps without SOB/CP Yes    Unable to Ambulate Not Asked    Total Care Not Asked    Ability To Do Own ADL's Yes    Uses Walker Not Asked    Other Activity Level Not Asked    Uses Cane Not Asked   Social History Narrative    Not on file     Social Determinants of Health     Financial Resource Strain: Not on file   Transportation Needs: Not on file   Social Connections: Not on file   Intimate Partner Violence: Not on file   Housing Stability: Not on file       Medications:  Current Medications[1]    Physical examination:   LMP 08/27/2021       Pain Score:   4  Gen: Alert &amp; Oriented X 3. Affect appropriate  HEENT: EOMI  Neck: Supple, no elevated JVP  Heart: Extremities well perfused  Lungs: non labored breathing  Abdomen: Non-distended  Skin: no gross lesions appreciated  Ext: purposeful movement of extremities   MS:     Root Right Left  Hip Flexion  L2 5 5  Knee Flexion  L5/S1 5 5  Knee Extension L3 5 5  Dorsiflexion  L4 5 5  Plantarflexion  S1 5 5  EHL Extension L5 5 5    Gait was smooth and symmetric with equal arm swing.      No pain reproduced with facet loading.    TTP through left glute med and left greater trochanter.  No tenderness to palpation along the spinous process, facet joints, paraspinal musculature, SI joints.    Patient is able to forward flex to knees, and able to extend without significant pain.    Full ROM bilateral lower extremities.    Negative slump testing.  Negative straight leg testing  to 30-75 degrees.    Negative Fortin Finger Sign.     Neuro:  DTR's   2+ right patella, 2+ left patella  2+ right achilles, 2+ left achilles    Lower Extremity Tone   Normal on the RIGHT  Normal on the LEFT    Lower Extremity Sensation   Sensation diminished to light touch through LLE in no dermatomal distribution.       This note was created with assistance from [employer] via capture of conversational audio.     Madsen Riddle, PA-C                             [1]   Current Outpatient Medications:     azelastine  (ASTELIN ) 137 mcg (0.1 %) Nasal Spray, Non-Aerosol, Administer 2 Sprays into each nostril Twice daily Use in each nostril as directed, Disp: 30 mL, Rfl: 11    buPROPion  (WELLBUTRIN  XL) 150 mg extended release 24 hr tablet, Take 1 Tablet (150 mg total) by mouth Daily, Disp: 90 Tablet, Rfl: 11    citalopram  (CELEXA ) 20 mg Oral Tablet, Take 1 Tablet (20 mg total) by mouth Daily, Disp: 90 Tablet, Rfl: 11    fluticasone  propionate (FLONASE ) 50 mcg/actuation Nasal Spray, Suspension, Administer 1 Spray into each nostril Daily 1 spray each nostril 1 time per day, Disp: 48 g, Rfl: 11    hydroCHLOROthiazide  (MICROZIDE ) 12.5 mg Oral Capsule, Take 1 Capsule (12.5 mg total) by mouth Daily, Disp: 90 Capsule, Rfl: 11    minocycline  (MINOCIN ) 100 mg Oral Capsule, Take 1 Capsule (100 mg total) by mouth Twice daily, Disp: 180 Capsule, Rfl: 4    Niacinamide  (NICOMIDE-T) 500 mg Oral Tablet, Take 1 Tablet (500 mg total) by mouth Daily, Disp: 90 Tablet, Rfl: 4    pantoprazole  (PROTONIX ) 40 mg Oral Tablet, Delayed Release (E.C.), Take 1 Tablet (40 mg total) by mouth Daily, Disp: 90 Tablet, Rfl: 3    pregabalin  (LYRICA ) 100 mg Oral Capsule, Take 1 Capsule (100 mg total) by mouth Twice daily, Disp: 60 Capsule, Rfl: 0    silver sulfADIAZINE (SILVADENE) 1 % Cream, Apply topically Twice daily (Patient taking differently: Apply topically Twice daily PRN), Disp: , Rfl:     SYMBICORT  160-4.5 mcg/actuation Inhalation oral inhaler, INHALE 2  PUFFS BY MOUTH TWICE A DAY (Patient taking differently: Take 2 Puffs by inhalation Twice daily PRN), Disp: 30.6 Each, Rfl: 6    Current Facility-Administered Medications:     lidocaine  1% injection, 2 mL, Intradermal, Once, Onesty Clair, PA-C    triamcinolone  acetonide (KENALOG -40) 40 mg/mL injection, 40 mg, Injection, Now, Saul Fabiano, PA-C

## 2024-03-25 ENCOUNTER — Other Ambulatory Visit (INDEPENDENT_AMBULATORY_CARE_PROVIDER_SITE_OTHER): Payer: Self-pay | Admitting: PHYSICIAN ASSISTANT

## 2024-03-29 ENCOUNTER — Encounter (INDEPENDENT_AMBULATORY_CARE_PROVIDER_SITE_OTHER): Payer: Self-pay | Admitting: PHYSICIAN ASSISTANT

## 2024-03-29 ENCOUNTER — Other Ambulatory Visit (INDEPENDENT_AMBULATORY_CARE_PROVIDER_SITE_OTHER): Payer: Self-pay | Admitting: PHYSICIAN ASSISTANT

## 2024-03-29 NOTE — Telephone Encounter (Signed)
 Duplicate, filled on 03/29/24

## 2024-04-28 ENCOUNTER — Telehealth (INDEPENDENT_AMBULATORY_CARE_PROVIDER_SITE_OTHER): Payer: Self-pay | Admitting: ANESTHESIOLOGY

## 2024-04-28 ENCOUNTER — Encounter (INDEPENDENT_AMBULATORY_CARE_PROVIDER_SITE_OTHER): Payer: Self-pay

## 2024-04-28 NOTE — Telephone Encounter (Signed)
 CM left voice message requesting a return phone call to review guidelines.  A MyChart message was sent to PT about Guidelines.    06/19/2023  Arminda Resides, CASE MANAGER

## 2024-04-29 ENCOUNTER — Ambulatory Visit: Payer: Self-pay | Attending: ANESTHESIOLOGY | Admitting: ANESTHESIOLOGY

## 2024-04-29 ENCOUNTER — Other Ambulatory Visit: Payer: Self-pay

## 2024-04-29 VITALS — Temp 97.5°F | Wt 298.2 lb

## 2024-04-29 DIAGNOSIS — M5416 Radiculopathy, lumbar region: Secondary | ICD-10-CM | POA: Insufficient documentation

## 2024-04-29 NOTE — Nursing Note (Signed)
[place] Pain Rating Scale     On a scale of 0-10, during the past 24 hours, pain has interfered with you usual activity: 10     On a scale of 0-10, during the past 24 hours, pain has interfered with your sleep: 9    On a scale of 0-10, during the past 24 hours, pain has affected your mood: 9     On a scale of 0-10, during the past 24 hours, pain has contributed to your stress: 10     On a scale of 0-10, what is your overall pain Rating: 9

## 2024-04-29 NOTE — Patient Instructions (Signed)
 [facility]  [address]  [place] [address]  Dept: [phone]  Dept Fax: [phone]  [phone]                                                 SPECIAL PROCEDURES                                     DISCHARGE FORM                                          [phone]      Please follow the instructions listed below for your procedures.  If you have questions concerning your procedure, you may call and leave a message.  Messages will be returned by the end of the next business day.  If you have an emergency, proceed to your local Emergency Department.      PROCEDURE: LEFT LUMBAR TRANSFORAMINAL EPIDURAL STEROID INJECTION    Do not drive a car or operate machinery until tomorrow.  Rest today and return to normal activities tomorrow.  If you are on restricted activities by your physician, please continue to follow these.  If you are not sure, contact your physician.  It is possible to experience mild numbness of the lower back and legs.  This is temporary.  If you have soreness at the injection site, the application of heat or ice may be helpful. Mild analgesics may also be used.  In case of severe headache; lie flat to decrease it.  Increase all fluids especially those with caffeine.  Mild analgesics are also appropriate.  If headache is not relieved by these measures, contact the Pain Clinic.  Steroid injections may cause temporary increase of blood sugar levels.    These instructions have been reviewed with the patient and appropriate questions have answers.  Zane Music, Ambulatory Care Assistant 04/29/2024 15:00

## 2024-04-29 NOTE — Procedures (Signed)
 [facility]  [address]  [facility] [place] [address]  Operated by [facility]  Procedure Note    Name: Cathline Dowen MRN:  [id number]   Date: 04/29/2024 DOB:  May 11, 1971 (53 y.o.)         INJ ANESTHETIC/STEROID-TRANSFORAMINAL EPIDURAL,LUMBAR/SACRAL,W FLUORO,UP TO 3 LEVELS    Performed by: Woodie Katz, MD  Authorized by: Boni Fleeting, PA-C    Immediately before the procedure, a time out was called:  Yes  Patient verified:  Yes  Procedure verified:  Yes  Site verified:  Yes  Number of levels::  1    Lumbar Transforaminal Epidural Steroid Injection with Fluoro    Pre Procedure Dx:  lumbar radiculopathy    Post Procedure Dx:  lumbar radiculopathy    Guidance:  Fluoroscopy; Fluoroscopy images have been saved to the PACS system.     Level: L5/S1    Side: Left    Skin: 3cc 1% lidocaine     Contrast: 2cc m200 Isovue    Injectate: 2cc 0.255 bupivacaine, 10 mg dexamethasone     Consent and timeout were done.  Patient was placed in the prone position.  Skin was prepped and draped in the routine fashion and sterile technique was maintained throughout.    In 30 degrees of ipsilateral oblique the fluoroscope was used to identify the neuroforamen  at: L5/s1                   Overlying skin was anesthetized.  Then, a 22 gauge spinal needle was advanced until trajectory was established toward the superior foramen.  Next, a lateral view was obtained and the needle was advanced until it reached the superior/anterior margin of the foramen.  After negative aspiration, contrast was injected under live fluoroscopy in both AP and lateral views.  Characteristic epidural spread was noted.  No vascular spread was noted.  After repeat negative aspiration the above solution was injected.  Patient tolerated this very well.  Patient returned to the recovery area with no complaints or apparent complications.       Katz Woodie, MD

## 2024-05-17 ENCOUNTER — Other Ambulatory Visit: Payer: Self-pay

## 2024-05-17 ENCOUNTER — Ambulatory Visit: Payer: Self-pay | Attending: PHYSICIAN ASSISTANT | Admitting: PHYSICIAN ASSISTANT

## 2024-05-17 ENCOUNTER — Encounter (INDEPENDENT_AMBULATORY_CARE_PROVIDER_SITE_OTHER): Payer: Self-pay | Admitting: PHYSICIAN ASSISTANT

## 2024-05-17 DIAGNOSIS — M7918 Myalgia, other site: Secondary | ICD-10-CM | POA: Insufficient documentation

## 2024-05-17 DIAGNOSIS — M47816 Spondylosis without myelopathy or radiculopathy, lumbar region: Secondary | ICD-10-CM | POA: Insufficient documentation

## 2024-05-17 DIAGNOSIS — E669 Obesity, unspecified: Secondary | ICD-10-CM

## 2024-05-17 DIAGNOSIS — M545 Low back pain, unspecified: Secondary | ICD-10-CM | POA: Insufficient documentation

## 2024-05-17 NOTE — Progress Notes (Signed)
 PHYSIATRY, SPINE CENTER  [address]  [place] [address]  Operated by [facility]     Name: Tannya Gonet MRN:  [id number]   Date:   05/17/2024 Age:     53 y.o.      Chief Complaint:   Chief Complaint   Patient presents with    Low Back Pain    Buttocks pain           HISTORY OF PRESENT ILLNESS:   History of Present Illness  Ketty Bitton is a 53 year old female with chronic lumbar radiculopathy and left piriformis syndrome who presents for follow-up of persistent low back and left buttock pain after left L5 TFESI on 04/29/24. She notes >90% relief of the left leg symptoms following the injection.      She has had a return of low back pain radiating to the left gluteal region and posterior leg.  Previous L5-S1 LESI in August 2025 offered 40% relief x4 months.  She says this is the same pain that she had prior to the injection.  Pain is provoked by prolonged standing, bending to standing, twisting, and lifting. Sitting briefly relieves pain but prolonged sitting worsens left buttock and leg discomfort. She has intermittent tingling in the posterior left leg with rare radiation to the dorsum of the left foot, and pain is more bothersome than paresthesias.    Buttock pain is predominant and has only improved with targeted piriformis injection at last visit. Notes nearly 100% relief x 3-4 weeks. No other interventions have meaningfully improved the buttock pain.    She completed physical therapy on June 2 with mild benefit and has not resumed it. Pain limits standing for tasks like cooking and short-distance ambulation at home. She relies on her son for lifting and avoids twisting. She spends most of the day sitting for work and can only walk or use a treadmill briefly before pain recurs. Prior nutrition counseling felt insufficiently structured for her needs.      TREATMENT HISTORY:     Helpful Not Helpful Not Tried  Comments   MBB       RFA/Cryoablation       Epidural x   L5-S1 on 01/01/24 - 50% relief  x1 week of leg pain.  40% relief of low back pain   Transforaminal/  Nerve root block  x   Left S1 on 02/11/2024 - nearly 100% relief of the S1 radicular symptoms   Left L5 on 04/29/24 - >90% relief of left L5 radicular symptoms   SI injection       Intraarticular Joint        Sympathetic Block       Other        TPI       GTB        SCS       Pain pump         Physical Therapy or HEP   x  Recent PT: current HEP   Massage Therapy       Acupuncture        Chiropractor       Exercise Physiologist        Counseling/CBT           ROS:   Denies changes in bowel or bladder function  All other systems negative except for any noted in the HPI    IMAGING:  Reviewed.    OUTSIDE RECORDS:  Reviewed.     ASSESSMENT:  Assessment/Plan   1. Lumbar spondylosis    2. Low back pain    3. Left buttock pain        Assessment & Plan  - Consider repeat L5-S1 LESI. She notes 40% relief of the low back pain following the previous ESI. May consider LMBBsx2 to RFA, so will discuss with her.   - Referred to physical therapy to address left piriformis syndrome.  - Advised to increase activity as tolerated, including walking and standing in small increments.  - Planned to repeat left piriformis injection in January if symptoms persist after therapy.    Obesity  - Counseled on the importance of weight reduction for surgical eligibility and overall health.  - Discussed strategies for increasing physical activity in small increments as pain allows.  - Recommended use of AI-based meal planning tools for structured, allergy-friendly meal plans and caloric goals.  - Encouraged continued engagement with dietary and activity modifications.        Risks/benefits of all pharmacologic and interventional treatments discussed and questions answered.    Past Medical History:  Past Medical History:   Diagnosis Date    Allergic rhinitis     Asthma     uses inhalers when allergies are bad    BMI 45.0-49.9, adult (CMS HCC)     Bullous pemphigoid (CMS HCC)      Chronic left-sided lumbar radiculopathy 11/30/2020    CPAP (continuous positive airway pressure) dependence     AutoCPAP 10-13 cm H2O    Esophageal reflux     Hypertension     due to Contrave     Mood disorder (CMS HCC)     Neck problem     harder turning head to the left    OSA on CPAP 11/30/2020    Sleep apnea            Family History:  Family Medical History:       Problem Relation (Age of Onset)    Alcohol abuse Mother, Father    Cancer Mother    Cirrhosis Mother    Lung Cancer Maternal Grandfather    Obesity Mother, Father    Ovarian Cancer Mother              Social History:  Social History     Socioeconomic History    Marital status: Single     Spouse name: Not on file    Number of children: Not on file    Years of education: Not on file    Highest education level: Not on file   Occupational History    Not on file   Tobacco Use    Smoking status: Never    Smokeless tobacco: Never   Vaping Use    Vaping status: Never Used   Substance and Sexual Activity    Alcohol use: Not Currently    Drug use: Never    Sexual activity: Not on file   Other Topics Concern    Ability to Walk 1 Flight of Steps without SOB/CP Yes    Routine Exercise Not Asked    Ability to Walk 2 Flight of Steps without SOB/CP Yes    Unable to Ambulate Not Asked    Total Care Not Asked    Ability To Do Own ADL's Yes    Uses Walker Not Asked    Other Activity Level Not Asked    Uses Cane Not Asked   Social History Narrative    Not on file  Social Determinants of Health     Financial Resource Strain: Not on file   Transportation Needs: Not on file   Social Connections: Not on file   Intimate Partner Violence: Not on file   Housing Stability: Not on file       Medications:  Current Medications[1]    Physical examination:   LMP 08/27/2021       Pain Score:   7  Gen: Alert &amp; Oriented X 3. Affect appropriate  HEENT: EOMI  Neck: Supple, no elevated JVP  Heart: Extremities well perfused  Lungs: non labored breathing  Abdomen: Non-distended  Skin:  no gross lesions appreciated  Ext: purposeful movement of extremities   Lower Extremities:  MS:     Root Right Left  Hip Flexion  L2 5 5  Knee Flexion  L5/S1 5 5  Knee Extension L3 5 5  Dorsiflexion  L4 5 5  Plantarflexion  S1 5 5  EHL Extension L5 5 5    Gait was smooth and symmetric with equal arm swing.      Facet loading bilaterally.  Positive Fortin finger.    Tenderness to palpation through bilateral PSIS and left piriformis.  No tenderness to palpation along the spinous process, facet joints, paraspinal musculature, greater trochanters.    Full ROM bilateral lower extremities.    Negative slump testing.  Negative straight leg testing to 30-75 degrees.    Negative thigh thrust.    Negative distraction testing.    Negative Patrick's FABER maneuver.    Neuro:  DTR's 2+ right patella, 2+ left patella  2+ right achilles, 2+ left achilles  Lower Extremity Tone Normal  Lower Extremity Sensation Intact to light touch bilaterally      This note was created with assistance from [employer] via capture of conversational audio.     Kathyrn Warmuth, PA-C                   [1]   Current Outpatient Medications:     azelastine  (ASTELIN ) 137 mcg (0.1 %) Nasal Spray, Non-Aerosol, Administer 2 Sprays into each nostril Twice daily Use in each nostril as directed, Disp: 30 mL, Rfl: 11    buPROPion  (WELLBUTRIN  XL) 150 mg extended release 24 hr tablet, Take 1 Tablet (150 mg total) by mouth Daily, Disp: 90 Tablet, Rfl: 11    citalopram  (CELEXA ) 20 mg Oral Tablet, Take 1 Tablet (20 mg total) by mouth Daily, Disp: 90 Tablet, Rfl: 11    fluticasone  propionate (FLONASE ) 50 mcg/actuation Nasal Spray, Suspension, Administer 1 Spray into each nostril Daily 1 spray each nostril 1 time per day, Disp: 48 g, Rfl: 11    hydroCHLOROthiazide  (MICROZIDE ) 12.5 mg Oral Capsule, Take 1 Capsule (12.5 mg total) by mouth Daily, Disp: 90 Capsule, Rfl: 11    minocycline  (MINOCIN ) 100 mg Oral Capsule, Take 1 Capsule (100 mg total) by mouth Twice daily, Disp: 180  Capsule, Rfl: 4    Niacinamide  (NICOMIDE-T) 500 mg Oral Tablet, Take 1 Tablet (500 mg total) by mouth Daily, Disp: 90 Tablet, Rfl: 4    pantoprazole  (PROTONIX ) 40 mg Oral Tablet, Delayed Release (E.C.), Take 1 Tablet (40 mg total) by mouth Daily, Disp: 90 Tablet, Rfl: 3    pregabalin  (LYRICA ) 100 mg Oral Capsule, TAKE 1 CAPSULE BY MOUTH TWICE DAILY., Disp: 60 Capsule, Rfl: 2    silver sulfADIAZINE (SILVADENE) 1 % Cream, Apply topically Twice daily (Patient taking differently: Apply topically Twice daily PRN), Disp: , Rfl:  SYMBICORT  160-4.5 mcg/actuation Inhalation oral inhaler, INHALE 2 PUFFS BY MOUTH TWICE A DAY (Patient taking differently: Take 2 Puffs by inhalation Twice daily PRN), Disp: 30.6 Each, Rfl: 6

## 2024-06-01 ENCOUNTER — Ambulatory Visit: Payer: Self-pay | Attending: Registered Nurse | Admitting: Registered Nurse

## 2024-06-01 ENCOUNTER — Other Ambulatory Visit: Payer: Self-pay

## 2024-06-01 ENCOUNTER — Encounter (HOSPITAL_BASED_OUTPATIENT_CLINIC_OR_DEPARTMENT_OTHER): Payer: Self-pay | Admitting: Registered Nurse

## 2024-06-01 ENCOUNTER — Ambulatory Visit (HOSPITAL_BASED_OUTPATIENT_CLINIC_OR_DEPARTMENT_OTHER): Admitting: Gynecology

## 2024-06-01 VITALS — BP 126/84 | HR 98 | Temp 97.0°F | Ht 61.0 in | Wt 293.0 lb

## 2024-06-01 DIAGNOSIS — E7800 Pure hypercholesterolemia, unspecified: Secondary | ICD-10-CM | POA: Insufficient documentation

## 2024-06-01 DIAGNOSIS — R7303 Prediabetes: Secondary | ICD-10-CM | POA: Insufficient documentation

## 2024-06-01 DIAGNOSIS — F39 Unspecified mood [affective] disorder: Secondary | ICD-10-CM | POA: Insufficient documentation

## 2024-06-01 DIAGNOSIS — M5416 Radiculopathy, lumbar region: Secondary | ICD-10-CM | POA: Insufficient documentation

## 2024-06-01 DIAGNOSIS — I10 Essential (primary) hypertension: Secondary | ICD-10-CM | POA: Insufficient documentation

## 2024-06-01 DIAGNOSIS — Z91018 Allergy to other foods: Secondary | ICD-10-CM | POA: Insufficient documentation

## 2024-06-01 DIAGNOSIS — K219 Gastro-esophageal reflux disease without esophagitis: Secondary | ICD-10-CM | POA: Insufficient documentation

## 2024-06-01 DIAGNOSIS — L120 Bullous pemphigoid: Secondary | ICD-10-CM | POA: Insufficient documentation

## 2024-06-01 DIAGNOSIS — Z6841 Body Mass Index (BMI) 40.0 and over, adult: Secondary | ICD-10-CM

## 2024-06-01 DIAGNOSIS — G4733 Obstructive sleep apnea (adult) (pediatric): Secondary | ICD-10-CM | POA: Insufficient documentation

## 2024-06-01 LAB — HGA1C (HEMOGLOBIN A1C WITH EST AVG GLUCOSE)
ESTIMATED AVERAGE GLUCOSE: 108 mg/dL
HEMOGLOBIN A1C: 5.4 % (ref 4.0–5.6)

## 2024-06-01 LAB — BASIC METABOLIC PANEL
ANION GAP: 12 mmol/L (ref 4–13)
BUN/CREA RATIO: 22 (ref 6–22)
BUN: 20 mg/dL (ref 8–25)
CALCIUM: 9 mg/dL (ref 8.6–10.2)
CHLORIDE: 103 mmol/L (ref 96–111)
CO2 TOTAL: 22 mmol/L (ref 22–30)
CREATININE: 0.89 mg/dL (ref 0.60–1.05)
GLUCOSE: 112 mg/dL (ref 65–125)
POTASSIUM: 4.9 mmol/L (ref 3.5–5.1)
SODIUM: 137 mmol/L (ref 136–145)

## 2024-06-01 LAB — LIPID PANEL
CHOL/HDL RATIO: 4.3
CHOLESTEROL: 200 mg/dL (ref 100–200)
HDL CHOL: 46 mg/dL — ABNORMAL LOW (ref >=50)
LDL CALC: 130 mg/dL — ABNORMAL HIGH (ref <100)
NON-HDL: 154 mg/dL (ref <=190)
TRIGLYCERIDES: 131 mg/dL (ref <150)
VLDL CALC: 23 mg/dL (ref <30)

## 2024-06-01 MED ORDER — WEGOVY 0.25 MG/0.5 ML SUBCUTANEOUS PEN INJECTOR: 0.2500 mg | PEN_INJECTOR | SUBCUTANEOUS | 0 refills | Status: AC

## 2024-06-01 MED ORDER — WEGOVY 0.5 MG/0.5 ML SUBCUTANEOUS PEN INJECTOR: 0.5000 mg | PEN_INJECTOR | SUBCUTANEOUS | 1 refills | Status: AC

## 2024-06-01 MED ORDER — BUPROPION HCL XL 150 MG 24 HR TABLET, EXTENDED RELEASE: 150.0000 mg | ORAL_TABLET | Freq: Every day | ORAL | 11 refills | Status: AC

## 2024-06-01 MED ORDER — HYDROCHLOROTHIAZIDE 12.5 MG CAPSULE: 12.5000 mg | ORAL_CAPSULE | Freq: Every day | ORAL | 11 refills | Status: AC

## 2024-06-01 MED ORDER — PANTOPRAZOLE 40 MG TABLET,DELAYED RELEASE: 40.0000 mg | DELAYED_RELEASE_TABLET | Freq: Every day | ORAL | 3 refills | Status: AC

## 2024-06-01 MED ORDER — MINOCYCLINE 100 MG CAPSULE: 100.0000 mg | ORAL_CAPSULE | Freq: Two times a day (BID) | ORAL | 4 refills | Status: AC

## 2024-06-01 MED ORDER — NIACINAMIDE 500 MG TABLET: 500.0000 mg | ORAL_TABLET | Freq: Every day | ORAL | 4 refills | Status: AC

## 2024-06-01 MED ORDER — CITALOPRAM 20 MG TABLET: 20.0000 mg | ORAL_TABLET | Freq: Every day | ORAL | 11 refills | Status: AC

## 2024-06-01 NOTE — Progress Notes (Signed)
 [facility] Physicians: Family Medicine Office Visit      Service date: 06/01/2024:     MRN: [id number]   Birth date: 12/12/1970  PCP: Alan Cooler, APRN, CNP    Sherry Matthews is a 54 y.o. female who presents to [facility] today with chief complaint of Medication Check F/U    HPI     History of Present Illness  Sherry Matthews is a 54 year old female who presents for a chronic medication follow-up.    She has a history of sleep apnea and is currently using CPAP therapy, following with a sleep medicine specialist for this condition. Her chronic allergic rhinitis is managed with astelin  and Flonase  nasal spray.      She has a history of bullous pemphigoid and currently on minocycline  and niacinamide  for this condition. Overall symptoms stable.     She is on Wellbutrin  and Celexa  for depression. Her mood is not well controlled at the moment, stating 'we suck right now.' She attributes some of her mood issues to her weight gain, which she finds depressing and states it leads to more eating and spending.    Her hypertension is treated with hydrochlorothiazide . She also has GERD, which is managed with pantoprazole .    She has lumbar spondylosis and is currently on pregabalin . She reports worsening back pain, which she attributes to her weight gain. She has had three injections for her back pain, with the last one being particularly painful and not providing lasting relief. She describes having a pinched nerve that radiates pain to her leg and another issue with her spine.    She is frustrated with her weight management, noting significant weight gain, which she believes is exacerbating her back pain. She has seen two dietitians but is dissatisfied with the guidance received, particularly noting a lack of structured meal planning that accommodates her multiple food allergies. Her weight is too high for back surgery, which adds to her frustration. She is unable to exercise as this exacerbates her back pain also. She  is seeking more structured dietary guidance to help manage her weight and improve her overall health. Would be interested in medication management if appropriate.   See ROS for further details.  ASSESSMENT/PLAN       ICD-10-CM    1. OSA on CPAP  G47.33 semaglutide  (WEGOVY ) 0.25 mg/0.5 mL Subcutaneous Pen Injector     semaglutide  (WEGOVY ) 0.5 mg/0.5 mL Subcutaneous Pen Injector      2. Mood disorder (CMS HCC)  F39 buPROPion  (WELLBUTRIN  XL) 150 mg extended release 24 hr tablet     citalopram  (CELEXA ) 20 mg Oral Tablet      3. Primary hypertension  I10 BASIC METABOLIC PANEL     hydroCHLOROthiazide  (MICROZIDE ) 12.5 mg Oral Capsule      4. Gastroesophageal reflux disease, unspecified whether esophagitis present  K21.9 pantoprazole  (PROTONIX ) 40 mg Oral Tablet, Delayed Release (E.C.)      5. Chronic left-sided lumbar radiculopathy  M54.16 semaglutide  (WEGOVY ) 0.25 mg/0.5 mL Subcutaneous Pen Injector     semaglutide  (WEGOVY ) 0.5 mg/0.5 mL Subcutaneous Pen Injector      6. Bullous pemphigoid (CMS HCC)  L12.0 minocycline  (MINOCIN ) 100 mg Oral Capsule     Niacinamide  (NICOMIDE-T) 500 mg Oral Tablet      7. Prediabetes  R73.03 HGA1C (HEMOGLOBIN A1C WITH EST AVG GLUCOSE)     semaglutide  (WEGOVY ) 0.25 mg/0.5 mL Subcutaneous Pen Injector     semaglutide  (WEGOVY ) 0.5 mg/0.5 mL Subcutaneous Pen Injector  8. Elevated LDL cholesterol level  E78.00 LIPID PANEL     semaglutide  (WEGOVY ) 0.25 mg/0.5 mL Subcutaneous Pen Injector     semaglutide  (WEGOVY ) 0.5 mg/0.5 mL Subcutaneous Pen Injector      9. Obesity, morbid, BMI 50 or higher (CMS HCC)  E66.01 Refer to [facility]     semaglutide  (WEGOVY ) 0.25 mg/0.5 mL Subcutaneous Pen Injector     semaglutide  (WEGOVY ) 0.5 mg/0.5 mL Subcutaneous Pen Injector      10. Multiple food allergies  Z91.018 Refer to [facility]        Assessment & Plan  Obesity  Chronic, uncontrolled.  Past medical history OSA, chronic back pain, hypertension, GERD, and elevated LDL.   BMI  55, worsening lumbar radiculopathy, affecting health. Interested in GLP-1 receptor agonists. Discussed Wegovy  and Zepbound, insurance prefers Wegovy . Side effects: nausea, vomiting, constipation. Insurance approval pending. Absent contraindications for patient.   - Ordered cholesterol and A1c labs for diabetes screening.  - Referred to dietitian for meal planning considering food allergies.  - Prescribed Wegovy  pending insurance approval.  - discussed appropriate use of medication.   - Scheduled follow-up in two months for weight check and medication adjustment.    Mood disorder  Managed with Wellbutrin  and Celexa . Worsening depression related to weight gain and health issues. Stressors: weight gain, back pain, ineffective weight management.    Chronic left-sided lumbar radiculopathy and spondylosis  Exacerbated by weight gain. Previous injections provided temporary relief. Pain affects activities and sleep. Weight loss may improve symptoms. Continue following with pain clinic.     Primary hypertension  Well controlled on hydrochlorothiazide .    Obstructive sleep apnea  Managed with CPAP. Weight gain may exacerbate condition.    Gastroesophageal reflux disease  Managed with pantoprazole .    Bullous pemphigoid  Managed with minocycline  and niacinamide . Some skin areas not responding well.    Chronic allergic rhinitis  Managed with Flonase  and Astelin  nasal spray.    General health maintenance  Declined flu vaccine due to egg allergy and previous adverse reaction.    Prediabetes  Potential prediabetes indicated by elevated BMI and weight gain. A1c to be checked.    Elevated LDL  Potential elevated LDL contributing to cardiovascular risk.  - Ordered cholesterol labs.    Recording duration: 22 minutes      Follow up on or around: 07/30/2024. Reason for Follow Up: weight check    This note was created with assistance from [employer] via capture of conversational audio.  Consent was obtained from the patient prior to  recording.        The patient was given ample opportunity to ask questions and those questions were answered to the patient's satisfaction. The patient was encouraged to be involved in their own care, and all diagnoses, medications, and medication side-effects were discussed. The patient was told to contact me with any additional questions or concerns, and to go to the emergency department in an emergency.     ROS   Review of Systems   Respiratory:  Negative for shortness of breath.    Cardiovascular:  Negative for chest pain.   Musculoskeletal:  Positive for back pain.     Pertinent discussed in HPI, otherwise all other systems negative.   MEDICAL HISTORY   Medical History:  Problem list, PMHx, PSHx, FHx, SHx, allergies, and current Rx were reviewed and updated as appropriate.  Past Medical History:   Diagnosis Date    Allergic rhinitis     Asthma  uses inhalers when allergies are bad    BMI 45.0-49.9, adult (CMS HCC)     Bullous pemphigoid (CMS HCC)     Chronic left-sided lumbar radiculopathy 11/30/2020    CPAP (continuous positive airway pressure) dependence     AutoCPAP 10-13 cm H2O    Esophageal reflux     Hypertension     due to Contrave     Mood disorder (CMS HCC)     Neck problem     harder turning head to the left    OSA on CPAP 11/30/2020    Sleep apnea       Current Outpatient Medications   Medication Instructions    azelastine  (ASTELIN ) 137 mcg (0.1 %) Nasal Spray, Non-Aerosol 2 Sprays, Each Nostril, 2 TIMES DAILY, Use in each nostril as directed    buPROPion  (WELLBUTRIN  XL) 150 mg, Oral, Daily    citalopram  (CELEXA ) 20 mg, Oral, Daily    fluticasone  propionate (FLONASE ) 50 mcg/actuation Nasal Spray, Suspension 1 Spray, Each Nostril, Daily, 1 spray each nostril 1 time per day    hydroCHLOROthiazide  (MICROZIDE ) 12.5 mg, Oral, Daily    minocycline  (MINOCIN ) 100 mg, Oral, 2 TIMES DAILY    Niacinamide  (NICOMIDE-T) 500 mg, Oral, Daily    pantoprazole  (PROTONIX ) 40 mg, Oral, Daily    pregabalin  (LYRICA ) 100  mg, Oral, 2 TIMES DAILY    silver sulfADIAZINE (SILVADENE) 1 % Cream 2 TIMES DAILY    SYMBICORT  160-4.5 mcg/actuation Inhalation oral inhaler 2 Puffs, Inhalation, 2 TIMES DAILY    Wegovy  0.25 mg, Subcutaneous, EVERY 7 DAYS    [START ON 07/02/2024] Wegovy  0.5 mg, Subcutaneous, EVERY 7 DAYS     OBJECTIVE   OBJECTIVE    BP 126/84   Pulse 98   Temp 36.1 C (97 F) (Thermal Scan)   Ht 1.549 m (5' 1)   Wt 133 kg (292 lb 15.9 oz)   LMP 08/27/2021   BMI 55.36 kg/m     Patient's last menstrual period was 08/27/2021.Body mass index is 55.36 kg/m.  Weight/BMI:  Wt Readings from Last 5 Encounters:   06/01/24 133 kg (292 lb 15.9 oz)   04/29/24 135 kg (298 lb 3.2 oz)   02/11/24 134 kg (294 lb 6.4 oz)   01/01/24 133 kg (292 lb 4.8 oz)   11/26/23 132 kg (291 lb 14.2 oz)      Physical Exam  Vitals reviewed.   Constitutional:       General: She is not in acute distress.  Cardiovascular:      Rate and Rhythm: Normal rate and regular rhythm.      Heart sounds: Normal heart sounds. No murmur heard.  Pulmonary:      Effort: Pulmonary effort is normal. No respiratory distress.      Breath sounds: Normal breath sounds.   Neurological:      General: No focal deficit present.      Mental Status: She is alert and oriented to person, place, and time.   Psychiatric:         Attention and Perception: Attention normal.         Mood and Affect: Affect normal.         Behavior: Behavior is cooperative.         Thought Content: Thought content normal.         Laboratory Studies/Data review:  Labs ordered today, pending.   Lab Results   Component Value Date    CHOLESTEROL 196 08/15/2023    HDLCHOL 41 (  L) 08/15/2023    LDLCHOL 134 (H) 08/15/2023    TRIG 117 08/15/2023      COMPREHENSIVE METABOLIC PANEL  Lab Results   Component Value Date    SODIUM 136 08/15/2023    POTASSIUM 4.0 08/15/2023    CHLORIDE 106 08/15/2023    CO2 22 08/15/2023    ANIONGAP 8 08/15/2023    BUN 21 08/15/2023    CREATININE 0.81 08/15/2023    GLUCOSE 112 08/15/2023     CALCIUM 8.4 (L) 08/15/2023    GFR 86 08/15/2023           Patient seen independently.   Alan Cooler, APRN, CNP  06/01/2024, 08:16  Alan Cooler, APRN, CNP  FAMILY MEDICINE, [facility]  Operated by [facility]  [address]  [place] [address]  Dept: [phone]  Dept Fax: [phone]    This document was generated using a voice recognition system and transcription. All documents are proofed as best as possible, but it may have misspelled words, incorrect words, or syntax and grammatical errors because of the imperfect nature of the system.

## 2024-06-02 ENCOUNTER — Ambulatory Visit (HOSPITAL_BASED_OUTPATIENT_CLINIC_OR_DEPARTMENT_OTHER): Payer: Self-pay | Admitting: Registered Nurse

## 2024-06-14 ENCOUNTER — Ambulatory Visit (HOSPITAL_BASED_OUTPATIENT_CLINIC_OR_DEPARTMENT_OTHER): Payer: Self-pay | Admitting: REGISTERED DIETITIAN OR NUTRITION PROFESSIONAL

## 2024-06-16 ENCOUNTER — Ambulatory Visit: Attending: Registered Nurse | Admitting: REGISTERED DIETITIAN OR NUTRITION PROFESSIONAL

## 2024-06-16 ENCOUNTER — Other Ambulatory Visit: Payer: Self-pay

## 2024-06-16 ENCOUNTER — Telehealth (INDEPENDENT_AMBULATORY_CARE_PROVIDER_SITE_OTHER): Payer: Self-pay | Admitting: PHYSICIAN ASSISTANT

## 2024-06-16 DIAGNOSIS — Z91018 Allergy to other foods: Secondary | ICD-10-CM | POA: Insufficient documentation

## 2024-06-16 NOTE — Progress Notes (Signed)
 [facility]  [facility]  [address] [place], [place] [address]  [phone]    Patient Name:  Sherry Matthews   MRN:  [id number]  DOB:  1971/02/18  Date of Service: 06/16/2024  PCP: Sherry Cooler, APRN, CNP    ASSESSMENT   Sherry Matthews is a 54 y.o. female referred by Matthews Alan, APRN, CNP for nutrition assessment and diet education related to Obesity.     Current Outpatient Medications   Medication Sig Dispense Refill    azelastine  (ASTELIN ) 137 mcg (0.1 %) Nasal Spray, Non-Aerosol Administer 2 Sprays into each nostril Twice daily Use in each nostril as directed 30 mL 11    buPROPion  (WELLBUTRIN  XL) 150 mg extended release 24 hr tablet Take 1 Tablet (150 mg total) by mouth Daily 90 Tablet 11    citalopram  (CELEXA ) 20 mg Oral Tablet Take 1 Tablet (20 mg total) by mouth Daily 90 Tablet 11    fluticasone  propionate (FLONASE ) 50 mcg/actuation Nasal Spray, Suspension Administer 1 Spray into each nostril Daily 1 spray each nostril 1 time per day 48 g 11    hydroCHLOROthiazide  (MICROZIDE ) 12.5 mg Oral Capsule Take 1 Capsule (12.5 mg total) by mouth Daily 90 Capsule 11    minocycline  (MINOCIN ) 100 mg Oral Capsule Take 1 Capsule (100 mg total) by mouth Twice daily 180 Capsule 4    Niacinamide  (NICOMIDE-T) 500 mg Oral Tablet Take 1 Tablet (500 mg total) by mouth Daily 90 Tablet 4    pantoprazole  (PROTONIX ) 40 mg Oral Tablet, Delayed Release (E.C.) Take 1 Tablet (40 mg total) by mouth Daily 90 Tablet 3    pregabalin  (LYRICA ) 100 mg Oral Capsule TAKE 1 CAPSULE BY MOUTH TWICE DAILY. 60 Capsule 2    semaglutide  (WEGOVY ) 0.25 mg/0.5 mL Subcutaneous Pen Injector Inject 0.5 mL (0.25 mg total) under the skin Every 7 days 2 mL 0    [START ON 07/02/2024] semaglutide  (WEGOVY ) 0.5 mg/0.5 mL Subcutaneous Pen Injector Inject 0.5 mL (0.5 mg total) under the skin Every 7 days 2 mL 1    silver sulfADIAZINE (SILVADENE) 1 % Cream Apply topically Twice daily (Patient taking differently: Apply  topically Twice daily PRN)      SYMBICORT  160-4.5 mcg/actuation Inhalation oral inhaler INHALE 2 PUFFS BY MOUTH TWICE A DAY (Patient taking differently: Take 2 Puffs by inhalation Twice daily PRN) 30.6 Each 6     No current facility-administered medications for this visit.     Problem List[1]    Allergies[2]    Vitals: 06/01/24  Ht: 1.549 m (5' 1)  Wt: 133 kg (292 lb 15.9 oz)  BMI: 55.36 kg/m    Nutrition-Related Lab Values of Interest:   Lab Results   Component Value Date    HA1C 5.4 06/01/2024     Lab Results   Component Value Date    CHOLESTEROL 200 06/01/2024    HDLCHOL 46 (L) 06/01/2024    LDLCHOL 130 (H) 06/01/2024    TRIG 131 06/01/2024      Appointment #: 1st     Chief Complaint/  Subjective: Desires weight loss. Recently started Wegovy .    Social: Employed from home. Lives with son.    Weight History: Wt Readings from Last 5 Encounters:   06/01/24 133 kg (292 lb 15.9 oz)   04/29/24 135 kg (298 lb 3.2 oz)   02/11/24 134 kg (294 lb 6.4 oz)   01/01/24 133 kg (292 lb 4.8 oz)   11/26/23 132 kg (291 lb  14.2 oz)      Diet: Sherry Matthews is working to increase dietary awareness using the [employer] She typically consumes 3 meals/day. Expresses pain associated with and poor motivation to cook. Frequent intake of meal replacement shakes, frozen meals. Denies sugar-sweetened beverages.    Physical Activity: Limited mobility related to back pain.    Stage of Change: Contemplative     DIAGNOSIS   Obesity related to excess energy intake as evidenced by BMI > 30.    INTERVENTION   Reviewed and provided patient with my Guidelines for Successful Weight Loss handout, which includes the following information:  Establish a regular, routine meal schedule.   Aim to eat 3 meals a day. Do not skip meals!  Plan meals around the same time every day.   Practice portion control of Carbohydrates, and increase intake of lean Proteins and Non-starchy Vegetables:    The USDA recommends at least:  2 Servings/Day of Fruit  (1 serving =  cup)  3  Servings/Day of Vegetables  (1 serving = 1 cup raw,  cup cooked)   Plan ahead for meals prepared at home.   Provided patient with a list of online resources for simple, balanced recipes and meal planning.  Practice mindful eating.  Limit distractions while eating (phone, TV, book, etc.). Eat while seated at the dining table.   Slow down! Meals should take approximately 30 minutes.   Avoid buying foods that you are likely to overeat or eat too often if readily available.  Read food labels for suggested serving size.   Track your Calories using apps, such as Lose It or MyFitnessPal.com.   Worked with patient to establish S.M.A.R.T. goals for change:  Plan ahead for at least 2 dinners prepared at home per week. Use recipe resources provided and quick-prep options discussed.   Increase physical activity by aiming to achieve 100 more steps a day.  Answered all food and nutrition-related questions.    Recommendations/Plan:   Adhere to established S.M.A.R.T. goals.  Consume a variety of whole foods from all food groups following USDA Dietary Guidelines.  Avoid all sugar-sweetened beverages.  Establish regular, routine times for meals and sit-down snacks.   Be physically active for at least 60 minutes every day.  Follow up as needed.    Understanding: Good  Expected Adherence: Poor    MONITORING AND EVALUATION   Continue to monitor weight and nutrition-related lab values.  Evaluate lifestyle modifications.  Encourage continued modifications to promote healthy weight management and normalization of nutrition-related lab values.    Time Spent: 45 minutes: 100% face-to-face counseling time.    Sherry Matthews, RD 06/16/2024, 07:57         [1]   Patient Active Problem List  Diagnosis    OSA on CPAP    Chronic left-sided lumbar radiculopathy    BMI 45.0-49.9, adult (CMS HCC)    Mood disorder (CMS HCC)    Esophageal reflux    Bullous pemphigoid (CMS HCC)    Allergic rhinitis    Controlled substance agreement signed    Primary  hypertension   [2]   Allergies  Allergen Reactions    Latex Rash    Carrot Rash    Eggs     Peanut     Shellfish Derived     Whey     Lima Bean Hives/ Urticaria

## 2024-06-16 NOTE — Nursing Note (Signed)
 Message from [place] sent at 06/16/2024  9:51 AM EST    Summary: Clinical Question    Copied From CRM #[id number].  [facility] (Other) called with a clinical question.    [facility] pt    They are needing a new order for PT faxed to [phone].  They was told it was already faxed to them but they have not received it.                Faxed face sheet and PT script as requested. Notified patient that this was completed    Armida Ped, RN

## 2024-06-16 NOTE — Patient Instructions (Addendum)
 Goals:  Plan ahead for at least 2 dinners prepared at home per week.   Use ChatGPT to help create meals specific to your tastes.   Focus on no-prep options (pre-cut/canned/frozen).   Get 100 more steps a day.      Weight Loss Tips  1. Eat on a consistent schedule.  Aim to eat 3 meals a day. Do not skip meals!   Plan meals around the same time every day.      2. Eat your Fruits and Vegetables!   The USDA recommends at least:   2 Servings/Day of Fruit 1 serving =  cup   3 Servings/Day of Vegetables 1 serving = 1 cup raw,  cup cooked      3. Plan ahead for meals prepared at home.   Think about your schedule.    Develop a Menu. Look online for recipes:    [employer], [employer], [employer] - Can access recipes for free.   eMeals app   SkinnyTaste.com   ChatGPT.com   Sheet pan meals  Make a Grocery List. Utilize online grocery pick-up orders to save time!     4. Practice mindful eating.   Limit distractions while eating (phone, TV, book, etc.). Eat while seated at the dining table.   Slow down! Meals should take approximately 30 minutes.   Avoid buying foods that you are likely to overeat or eat too often if readily available.   Read food labels for suggested serving size.  Track your Calories using apps, such as Lose It or MyFitnessPal.com.

## 2024-06-17 ENCOUNTER — Encounter (INDEPENDENT_AMBULATORY_CARE_PROVIDER_SITE_OTHER): Payer: Self-pay | Admitting: PHYSICIAN ASSISTANT

## 2024-06-18 ENCOUNTER — Other Ambulatory Visit (INDEPENDENT_AMBULATORY_CARE_PROVIDER_SITE_OTHER): Payer: Self-pay | Admitting: PHYSICIAN ASSISTANT

## 2024-06-18 DIAGNOSIS — M5416 Radiculopathy, lumbar region: Secondary | ICD-10-CM

## 2024-06-18 MED ORDER — PREGABALIN 100 MG CAPSULE: 100.0000 mg | ORAL_CAPSULE | Freq: Three times a day (TID) | ORAL | 2 refills | Status: AC

## 2024-06-18 NOTE — Nursing Note (Signed)
 Patient requested refill via MyChart. Dose was increased from 2x/day to 3x/day on 05/17/24.     Armida Ped, RN

## 2024-06-21 NOTE — Telephone Encounter (Signed)
 Filled on 06/18/24

## 2024-06-24 ENCOUNTER — Ambulatory Visit: Payer: Self-pay | Attending: PHYSICIAN ASSISTANT | Admitting: PHYSICIAN ASSISTANT

## 2024-06-24 ENCOUNTER — Other Ambulatory Visit: Payer: Self-pay

## 2024-06-24 DIAGNOSIS — E669 Obesity, unspecified: Secondary | ICD-10-CM

## 2024-06-24 DIAGNOSIS — M4726 Other spondylosis with radiculopathy, lumbar region: Secondary | ICD-10-CM

## 2024-06-24 DIAGNOSIS — M47816 Spondylosis without myelopathy or radiculopathy, lumbar region: Secondary | ICD-10-CM | POA: Insufficient documentation

## 2024-06-24 DIAGNOSIS — M5416 Radiculopathy, lumbar region: Secondary | ICD-10-CM | POA: Insufficient documentation

## 2024-06-24 DIAGNOSIS — M7918 Myalgia, other site: Secondary | ICD-10-CM | POA: Insufficient documentation

## 2024-06-24 DIAGNOSIS — M545 Low back pain, unspecified: Secondary | ICD-10-CM | POA: Insufficient documentation

## 2024-06-24 DIAGNOSIS — Z6841 Body Mass Index (BMI) 40.0 and over, adult: Secondary | ICD-10-CM

## 2024-06-24 MED ORDER — TRIAMCINOLONE ACETONIDE 40 MG/ML SUSPENSION FOR INJECTION
40.0000 mg | INTRAMUSCULAR | Status: AC
  Administered 2024-06-24: 40 mg via INTRAMUSCULAR

## 2024-06-24 MED ORDER — LIDOCAINE HCL 10 MG/ML (1 %) INJECTION SOLUTION
2.0000 mL | Freq: Once | INTRAMUSCULAR | Status: AC
  Administered 2024-06-24: 20 mg via INTRADERMAL

## 2024-06-24 NOTE — Procedures (Signed)
 [facility]  [address]  [place] [address]  Operated by [facility]  Procedure Note    Name: Sherry Matthews MRN:  [id number]   Date: 06/24/2024 DOB:  Sep 15, 1970 (53 y.o.)         64445 - INJ ANESTHETIC/STEROID; SCIATIC NERVE (AMB ONLY-PD)    Performed by: Boni Fleeting, PA-C  Authorized by: Boni Fleeting, PA-C    Time Out:     Immediately before the procedure, a time out was called:  Yes    Patient verified:  Yes    Procedure Verified:  Yes    Site Verified:  Yes  Documentation:      Piriformis injection    Patient was positioned in a prone position.  The area over the left piriformis was palpated and identified.  The area was prepped in the usual sterile fashion.  Ethyl chloride spray was utilized for topical anesthesia.  A solution of 2 cc of 1% lidocaine  and 1 cc of 40 milligram/cc of Kenalog  was mixed and injected along the piriformis muscle.  This was performed on the left side only.  Patient tolerated the procedure well had no complications.  They left the clinic in satisfactory condition.        [name], PA-C

## 2024-07-09 ENCOUNTER — Ambulatory Visit (HOSPITAL_BASED_OUTPATIENT_CLINIC_OR_DEPARTMENT_OTHER): Payer: Self-pay | Admitting: Nurse Practitioner

## 2024-08-04 ENCOUNTER — Ambulatory Visit (HOSPITAL_BASED_OUTPATIENT_CLINIC_OR_DEPARTMENT_OTHER): Payer: Self-pay | Admitting: Registered Nurse

## 2024-08-24 ENCOUNTER — Ambulatory Visit (INDEPENDENT_AMBULATORY_CARE_PROVIDER_SITE_OTHER): Payer: Self-pay | Admitting: PHYSICIAN ASSISTANT
